# Patient Record
Sex: Female | Born: 1960
Health system: Southern US, Community
[De-identification: ages and names within clinical notes are randomized; demographics above are authoritative.]

## PROBLEM LIST (undated history)

## (undated) DIAGNOSIS — F419 Anxiety disorder, unspecified: Secondary | ICD-10-CM

## (undated) DIAGNOSIS — IMO0002 Reserved for concepts with insufficient information to code with codable children: Secondary | ICD-10-CM

## (undated) DIAGNOSIS — E559 Vitamin D deficiency, unspecified: Secondary | ICD-10-CM

## (undated) DIAGNOSIS — N951 Menopausal and female climacteric states: Secondary | ICD-10-CM

## (undated) DIAGNOSIS — B999 Unspecified infectious disease: Secondary | ICD-10-CM

## (undated) DIAGNOSIS — D219 Benign neoplasm of connective and other soft tissue, unspecified: Secondary | ICD-10-CM

## (undated) DIAGNOSIS — K760 Fatty (change of) liver, not elsewhere classified: Secondary | ICD-10-CM

## (undated) DIAGNOSIS — E7800 Pure hypercholesterolemia, unspecified: Secondary | ICD-10-CM

## (undated) DIAGNOSIS — D1803 Hemangioma of intra-abdominal structures: Secondary | ICD-10-CM

## (undated) DIAGNOSIS — N809 Endometriosis, unspecified: Secondary | ICD-10-CM

## (undated) HISTORY — DX: Anxiety disorder, unspecified: F41.9

## (undated) HISTORY — DX: Reserved for concepts with insufficient information to code with codable children: IMO0002

## (undated) HISTORY — DX: Menopausal and female climacteric states: N95.1

## (undated) HISTORY — DX: Pure hypercholesterolemia, unspecified: E78.00

## (undated) HISTORY — DX: Fatty (change of) liver, not elsewhere classified: K76.0

## (undated) HISTORY — PX: OTHER SURGICAL HISTORY: SHX169

## (undated) HISTORY — DX: Vitamin D deficiency, unspecified: E55.9

## (undated) HISTORY — DX: Hemangioma of intra-abdominal structures: D18.03

---

## 1978-01-13 HISTORY — PX: APPENDECTOMY: SHX54

## 1987-01-14 DIAGNOSIS — IMO0002 Reserved for concepts with insufficient information to code with codable children: Secondary | ICD-10-CM

## 1987-01-14 HISTORY — DX: Reserved for concepts with insufficient information to code with codable children: IMO0002

## 1987-01-14 HISTORY — PX: CERVICAL CONE BIOPSY: SUR198

## 1998-01-13 HISTORY — PX: MYOMECTOMY: SHX85

## 2001-01-13 HISTORY — PX: UTERINE FIBROID EMBOLIZATION: SHX825

## 2005-05-09 ENCOUNTER — Other Ambulatory Visit: Admission: RE | Admit: 2005-05-09 | Discharge: 2005-05-09 | Payer: Self-pay | Admitting: Family Medicine

## 2005-12-04 ENCOUNTER — Emergency Department (HOSPITAL_COMMUNITY): Admission: EM | Admit: 2005-12-04 | Discharge: 2005-12-04 | Payer: Self-pay | Admitting: Emergency Medicine

## 2006-05-13 ENCOUNTER — Other Ambulatory Visit: Admission: RE | Admit: 2006-05-13 | Discharge: 2006-05-13 | Payer: Self-pay | Admitting: Family Medicine

## 2007-07-15 ENCOUNTER — Other Ambulatory Visit: Admission: RE | Admit: 2007-07-15 | Discharge: 2007-07-15 | Payer: Self-pay | Admitting: Family Medicine

## 2009-03-13 HISTORY — PX: DILATION AND CURETTAGE OF UTERUS: SHX78

## 2009-03-21 ENCOUNTER — Ambulatory Visit (HOSPITAL_COMMUNITY): Admission: RE | Admit: 2009-03-21 | Discharge: 2009-03-21 | Payer: Self-pay | Admitting: Obstetrics and Gynecology

## 2010-04-08 LAB — CBC
RBC: 4.44 MIL/uL (ref 3.87–5.11)
RDW: 15.6 % — ABNORMAL HIGH (ref 11.5–15.5)

## 2010-10-31 ENCOUNTER — Encounter: Payer: Self-pay | Admitting: Internal Medicine

## 2010-10-31 ENCOUNTER — Ambulatory Visit (HOSPITAL_BASED_OUTPATIENT_CLINIC_OR_DEPARTMENT_OTHER)
Admission: RE | Admit: 2010-10-31 | Discharge: 2010-10-31 | Disposition: A | Discharge status: Home / Self Care | Payer: Federal, State, Local not specified - PPO | Source: Ambulatory Visit | Attending: Internal Medicine | Admitting: Internal Medicine

## 2010-10-31 ENCOUNTER — Other Ambulatory Visit: Payer: Self-pay | Admitting: Internal Medicine

## 2010-10-31 ENCOUNTER — Ambulatory Visit (INDEPENDENT_AMBULATORY_CARE_PROVIDER_SITE_OTHER): Payer: Federal, State, Local not specified - PPO | Admitting: Internal Medicine

## 2010-10-31 DIAGNOSIS — Z780 Asymptomatic menopausal state: Secondary | ICD-10-CM

## 2010-10-31 DIAGNOSIS — F419 Anxiety disorder, unspecified: Secondary | ICD-10-CM | POA: Insufficient documentation

## 2010-10-31 DIAGNOSIS — Z1231 Encounter for screening mammogram for malignant neoplasm of breast: Secondary | ICD-10-CM

## 2010-10-31 DIAGNOSIS — N951 Menopausal and female climacteric states: Secondary | ICD-10-CM

## 2010-10-31 DIAGNOSIS — Z803 Family history of malignant neoplasm of breast: Secondary | ICD-10-CM

## 2010-10-31 NOTE — Patient Instructions (Signed)
Come in for fasting labs .  They will be mailed to you  Get mammogram today  Schedule complete physical with me

## 2010-10-31 NOTE — Progress Notes (Signed)
Addended by: Granville Lewis on: 10/31/2010 01:44 PM   Modules accepted: Orders

## 2010-10-31 NOTE — Progress Notes (Signed)
Subjective:    Patient ID: Danielle Arnold, female    DOB: 1960/01/14, 50 y.o.   MRN: [id number]  HPI  New pt here for first visit.  [facility] [place].  GYN  Dr. Henderson Cloud  PMH of mild anxiety and cervical dysplasia treated in the late 1980's - pt reports last several cervical paps have all been normal.  She wishes to discuss treatment options for menopausal symptoms.  She has night flushes that seem to be improving, dry skin, and lots of vaginal dryness that is causing discomfort with intercourse.  She reports her Lmp was 10/11 and then she did bleed again in 5/12 for about 20 days.  She was going to see her GYN but eventually stopped so did not have this evaluated.    She has a sister and MGM  with breast cancer.  No  Personal histor of DVT or PE or MI or Stroke  .  She is also due for her mammogram  Allergies  Allergen Reactions  . Penicillins Rash  . Percocet (Oxycodone-Acetaminophen) Itching   Past Medical History  Diagnosis Date  . Menopausal hot flushes   . Abnormal Pap smear and cervical HPV (human papillomavirus) 1989    Cone biopsy  ????dysplasia  . Anxiety     mild on no meds   Past Surgical History  Procedure Date  . Dilation and curettage of uterus 3/11  . Uterine fibroid embolization 2003  . Myomectomy 2000  . Cervical cone biopsy 89  . Ear operation 89 67 68    ear drum repair  . Appendectomy 80   History   Social History  . Marital Status: Single    Spouse Name: N/A    Number of Children: N/A  . Years of Education: N/A   Occupational History  . Not on file.   Social History Main Topics  . Smoking status: Never Smoker   . Smokeless tobacco: Never Used  . Alcohol Use: Yes     glass of wine 3-4 times per week  . Drug Use: No  . Sexually Active: No   Other Topics Concern  . Not on file   Social History Narrative  . No narrative on file   Family History  Problem Relation Age of Onset  . Cancer Sister     breast  premenopausal  .  Cancer Maternal Grandmother     breast   Patient Active Problem List  Diagnoses  . Anxiety  . Menopausal hot flushes  . FH: breast cancer in first degree relative   No current outpatient prescriptions on file prior to visit.        Review of Systems    No chest pain, no SOB, no LE edema, no headache no visual changes Objective:   Physical Exam Physical Exam  Nursing note and vitals reviewed.  Constitutional: She is oriented to person, place, and time. She appears well-developed and well-nourished.  HENT:  Head: Normocephalic and atraumatic.  Cardiovascular: Normal rate and regular rhythm. Exam reveals no gallop and no friction rub.  No murmur heard.  Pulmonary/Chest: Breath sounds normal. She has no wheezes. She has no rales.  Neurological: She is alert and oriented to person, place, and time.  Skin: Skin is warm and dry.  Psychiatric: She has a normal mood and affect. Her behavior is normal.  LE no edema      Assessment & Plan:  1)  Symptomatic menopause:  Extensive counseling of both HT and non  HT options.  Pt given risk/benefit sheet for HT.  I think a non HT option is best for her given sister and MGM with breast CA.  She declines BRCA testing today.   Will check labs on another day as she is not fasting and had 2 eggs for breakfast.  Sheis due for a CPE and will schedule .  Mammogram today  2)  Mild anxiety 3)  Remote cervical dysplasia 4)  FH sister breast CA 5)  Hyperlipidemia  I spent 45 mintutes with this pt

## 2010-11-21 ENCOUNTER — Encounter: Payer: Self-pay | Admitting: Internal Medicine

## 2010-11-21 ENCOUNTER — Ambulatory Visit (INDEPENDENT_AMBULATORY_CARE_PROVIDER_SITE_OTHER): Payer: Federal, State, Local not specified - PPO | Admitting: Internal Medicine

## 2010-11-21 DIAGNOSIS — Z113 Encounter for screening for infections with a predominantly sexual mode of transmission: Secondary | ICD-10-CM

## 2010-11-21 DIAGNOSIS — N951 Menopausal and female climacteric states: Secondary | ICD-10-CM

## 2010-11-21 DIAGNOSIS — Z01419 Encounter for gynecological examination (general) (routine) without abnormal findings: Secondary | ICD-10-CM

## 2010-11-21 DIAGNOSIS — F419 Anxiety disorder, unspecified: Secondary | ICD-10-CM

## 2010-11-21 DIAGNOSIS — Z803 Family history of malignant neoplasm of breast: Secondary | ICD-10-CM

## 2010-11-21 DIAGNOSIS — F411 Generalized anxiety disorder: Secondary | ICD-10-CM

## 2010-11-21 DIAGNOSIS — K589 Irritable bowel syndrome without diarrhea: Secondary | ICD-10-CM | POA: Insufficient documentation

## 2010-11-21 DIAGNOSIS — Z780 Asymptomatic menopausal state: Secondary | ICD-10-CM

## 2010-11-21 DIAGNOSIS — Z139 Encounter for screening, unspecified: Secondary | ICD-10-CM

## 2010-11-21 DIAGNOSIS — Z23 Encounter for immunization: Secondary | ICD-10-CM

## 2010-11-21 DIAGNOSIS — D649 Anemia, unspecified: Secondary | ICD-10-CM | POA: Insufficient documentation

## 2010-11-21 DIAGNOSIS — Z1272 Encounter for screening for malignant neoplasm of vagina: Secondary | ICD-10-CM

## 2010-11-21 DIAGNOSIS — R319 Hematuria, unspecified: Secondary | ICD-10-CM

## 2010-11-21 LAB — CBC WITH DIFFERENTIAL/PLATELET
Basophils Absolute: 0 10*3/uL (ref 0.0–0.1)
Basophils Relative: 1 % (ref 0–1)
Eosinophils Absolute: 0.1 10*3/uL (ref 0.0–0.7)
HCT: 44 % (ref 36.0–46.0)
Lymphs Abs: 1.1 10*3/uL (ref 0.7–4.0)
MCV: 86.6 fL (ref 78.0–100.0)
Monocytes Absolute: 0.3 10*3/uL (ref 0.1–1.0)
Monocytes Relative: 7 % (ref 3–12)
Neutro Abs: 2.9 10*3/uL (ref 1.7–7.7)
RBC: 5.08 MIL/uL (ref 3.87–5.11)

## 2010-11-21 LAB — POCT URINALYSIS DIPSTICK
Glucose, UA: NEGATIVE
Ketones, UA: NEGATIVE
Nitrite, UA: NEGATIVE
Protein, UA: NEGATIVE
Spec Grav, UA: <=1.005
Urobilinogen, UA: NEGATIVE

## 2010-11-21 LAB — FERRITIN: Ferritin: 36 ng/mL (ref 10–291)

## 2010-11-21 MED ORDER — TETANUS-DIPHTH-ACELL PERTUSSIS 5-2.5-18.5 LF-MCG/0.5 IM SUSP: 0.5000 mL | Freq: Once | INTRAMUSCULAR | Status: DC

## 2010-11-21 NOTE — Patient Instructions (Signed)
Labs will be mailed to you  Return prn

## 2010-11-21 NOTE — Progress Notes (Signed)
Subjective:    Patient ID: Danielle Arnold, female    DOB: 10/31/1960, 50 y.o.   MRN: [id number]  HPI  Borski is here for a comprhensive evaluation.  Overall doing well .  She states hot flushes have become less frequent and not too bothersome now.  Her sister would not have BRCA testing and pt is not sure she wants testing now.  Last tetanus 2005.  Has not had pertussis booster.   Colonoscopy done 2008  No polyps found at that time.  Mammogram UTD and neg.  She does report she has had anemia in the past and she states she has donated blood 2-3 times in the last one year period.    She also requests STD testing.  She is out of a relationship for about a year and is not sexually active but wishes to know about STD status.  She did have some urinary urgency that just started today  Allergies  Allergen Reactions  . Penicillins Rash  . Percocet (Oxycodone-Acetaminophen) Itching   Past Medical History  Diagnosis Date  . Menopausal hot flushes   . Abnormal Pap smear and cervical HPV (human papillomavirus) 1989    Cone biopsy  ????dysplasia  . Anxiety     mild on no meds   Past Surgical History  Procedure Date  . Dilation and curettage of uterus 3/11  . Uterine fibroid embolization 2003  . Myomectomy 2000  . Cervical cone biopsy 89  . Ear operation 89 67 68    ear drum repair  . Appendectomy 80   History   Social History  . Marital Status: Single    Spouse Name: N/A    Number of Children: N/A  . Years of Education: N/A   Occupational History  . Not on file.   Social History Main Topics  . Smoking status: Never Smoker   . Smokeless tobacco: Never Used  . Alcohol Use: Yes     glass of wine 3-4 times per week  . Drug Use: No  . Sexually Active: No   Other Topics Concern  . Not on file   Social History Narrative  . No narrative on file   Family History  Problem Relation Age of Onset  . Cancer Sister     breast  premenopausal  . Cancer Maternal Grandmother    breast   Patient Active Problem List  Diagnoses  . Anxiety  . Menopausal hot flushes  . FH: breast cancer in first degree relative  . Anemia  . Irritable bowel   Current Outpatient Prescriptions on File Prior to Visit  Medication Sig Dispense Refill  . cholecalciferol (VITAMIN D) 1000 UNITS tablet Take 400 Units by mouth daily.       . Probiotic Product (SOLUBLE FIBER/PROBIOTICS PO) Take by mouth as needed.        . vitamin B-12 (CYANOCOBALAMIN) 100 MCG tablet Take 50 mcg by mouth daily.        . vitamin E 400 UNIT capsule Take 400 Units by mouth daily.        Marland Kitchen zinc gluconate 50 MG tablet Take 50 mg by mouth daily.         No current facility-administered medications on file prior to visit.         Review of Systems See HPI    Objective:   Physical Exam Physical Exam  Vital signs and nursing note reviewed  Constitutional: She is oriented to person, place, and time. She appears well-developed  and well-nourished. She is cooperative.  HENT:  Head: Normocephalic and atraumatic.  Right Ear: Tympanic membrane normal.  Left Ear: Tympanic membrane normal.  Nose: Nose normal.  Mouth/Throat: Oropharynx is clear and moist and mucous membranes are normal. No oropharyngeal exudate or posterior oropharyngeal erythema.  Eyes: Conjunctivae and EOM are normal. Pupils are equal, round, and reactive to light.  Neck: Neck supple. No JVD present. Carotid bruit is not present. No mass and no thyromegaly present.  Cardiovascular: Regular rhythm, normal heart sounds, intact distal pulses and normal pulses.  Exam reveals no gallop and no friction rub.   No murmur heard. Pulses:      Dorsalis pedis pulses are 2+ on the right side, and 2+ on the left side.  Pulmonary/Chest: Breath sounds normal. She has no wheezes. She has no rhonchi. She has no rales. Right breast exhibits no mass, no nipple discharge and no skin change. Left breast exhibits no mass, no nipple discharge and no skin change.    Abdominal: Soft. Bowel sounds are normal. She exhibits no distension and no mass. There is no hepatosplenomegaly. There is no tenderness. There is no CVA tenderness.  Genitourinary: Rectum normal, vagina normal and uterus normal. Rectal exam shows no mass. Guaiac negative stool. No labial fusion. There is no lesion on the right labia. There is no lesion on the left labia. Cervix exhibits no motion tenderness. Right adnexum displays no mass, no tenderness and no fullness. Left adnexum displays no mass, no tenderness and no fullness. No erythema around the vagina.  Musculoskeletal:       No active synovitis to any joint.    Lymphadenopathy:       Right cervical: No superficial cervical adenopathy present.      Left cervical: No superficial cervical adenopathy present.       Right axillary: No pectoral and no lateral adenopathy present.       Left axillary: No pectoral and no lateral adenopathy present.      Right: No inguinal adenopathy present.       Left: No inguinal adenopathy present.  Neurological: She is alert and oriented to person, place, and time. She has normal strength and normal reflexes. No cranial nerve deficit or sensory deficit. She displays a negative Romberg sign. Coordination and gait normal.  Skin: Skin is warm and dry. No abrasion, no bruising, no ecchymosis and no rash noted. No cyanosis. Nails show no clubbing.  Psychiatric: She has a normal mood and affect. Her speech is normal and behavior is normal.          Assessment & Plan:  1)  Health Maintenance  : Tdap given today.  Declines flu vaccine despite my counsel.  Pap today with Chlamydia and GC.Marland Kitchen   Check serology for RPR and HIv See scanned HM sheet  Dash diet given 2)  H/o anemia  Will check CBC and ferritin today.  Lipids and chemistries  3)  FH pos for breast ca  She does not wish BRCA now 4)  Vasomotor flushes   Improved for no 5)  Abnormal u/a  Will send for culture pt advise to call Monday for  results        Assessment & Plan:

## 2010-11-22 LAB — COMPREHENSIVE METABOLIC PANEL
Albumin: 4.5 g/dL (ref 3.5–5.2)
Calcium: 9.9 mg/dL (ref 8.4–10.5)
Chloride: 100 mEq/L (ref 96–112)
Glucose, Bld: 74 mg/dL (ref 70–99)
Potassium: 4.1 mEq/L (ref 3.5–5.3)
Sodium: 140 mEq/L (ref 135–145)
Total Protein: 7.2 g/dL (ref 6.0–8.3)

## 2010-11-22 LAB — LIPID PANEL
Cholesterol: 217 mg/dL — ABNORMAL HIGH (ref 0–200)
HDL: 88 mg/dL (ref >39)
LDL Cholesterol: 118 mg/dL — ABNORMAL HIGH (ref 0–99)
Total CHOL/HDL Ratio: 2.5 Ratio
Triglycerides: 54 mg/dL (ref <150)

## 2010-11-23 LAB — CULTURE, URINE COMPREHENSIVE: Organism ID, Bacteria: NO GROWTH

## 2010-11-25 ENCOUNTER — Encounter: Payer: Self-pay | Admitting: Emergency Medicine

## 2010-11-25 ENCOUNTER — Telehealth: Payer: Self-pay | Admitting: Internal Medicine

## 2010-11-25 DIAGNOSIS — B359 Dermatophytosis, unspecified: Secondary | ICD-10-CM

## 2010-11-25 MED ORDER — NYSTATIN-TRIAMCINOLONE 100000-0.1 UNIT/GM-% EX OINT: TOPICAL_OINTMENT | Freq: Two times a day (BID) | CUTANEOUS | Status: DC

## 2010-11-25 NOTE — Telephone Encounter (Signed)
Pt would like a call back; she thinks she may has bladder infection. Pt have some concerns, Please call back on [phone] thanks.

## 2010-11-25 NOTE — Telephone Encounter (Signed)
Spoke with Orilla, she is agreeable to cream and urine recheck.  She will come in for urinalysis

## 2010-11-25 NOTE — Telephone Encounter (Signed)
Tell Detjen I will send to her pharmacy an antifungal creme that also has an anti--inflammatory.  Use on outside of labia and any irritataed area.  Have her come in Thursday or early next week and I will recheck a urine specimen and examine her to see if she is better

## 2010-11-25 NOTE — Telephone Encounter (Signed)
PC with pt.  She states that she is having burning along the "outside" after she urinates.  She states more along labia and near clitoris, does not have urinary symptoms like urgency or frequency.  Burning comes and goes, had a lot over the weekend.  She is aware urine culture did not grow any bacteria.  She has not looked at the area with a mirror to know if it looks red or inflamed, but states she does hike a lot, sweats, does not go to the bathroom when she hikes.  Is concerned about symptoms, does not know what to do.  Has tried OTC cortisone cream to the area without benefit.

## 2010-11-28 ENCOUNTER — Encounter: Payer: Self-pay | Admitting: Emergency Medicine

## 2011-02-10 ENCOUNTER — Ambulatory Visit (INDEPENDENT_AMBULATORY_CARE_PROVIDER_SITE_OTHER): Payer: Federal, State, Local not specified - PPO | Admitting: Internal Medicine

## 2011-02-10 ENCOUNTER — Other Ambulatory Visit: Payer: Self-pay | Admitting: Internal Medicine

## 2011-02-10 ENCOUNTER — Ambulatory Visit (HOSPITAL_BASED_OUTPATIENT_CLINIC_OR_DEPARTMENT_OTHER)
Admission: RE | Admit: 2011-02-10 | Discharge: 2011-02-10 | Disposition: A | Discharge status: Home / Self Care | Payer: Federal, State, Local not specified - PPO | Source: Ambulatory Visit | Attending: Internal Medicine | Admitting: Internal Medicine

## 2011-02-10 ENCOUNTER — Encounter: Payer: Self-pay | Admitting: Internal Medicine

## 2011-02-10 VITALS — BP 105/67 | HR 66 | Temp 97.8°F | Resp 12 | Ht 66.0 in | Wt 132.1 lb

## 2011-02-10 DIAGNOSIS — R21 Rash and other nonspecific skin eruption: Secondary | ICD-10-CM

## 2011-02-10 DIAGNOSIS — S8990XA Unspecified injury of unspecified lower leg, initial encounter: Secondary | ICD-10-CM

## 2011-02-10 DIAGNOSIS — L309 Dermatitis, unspecified: Secondary | ICD-10-CM | POA: Insufficient documentation

## 2011-02-10 DIAGNOSIS — S99929A Unspecified injury of unspecified foot, initial encounter: Secondary | ICD-10-CM

## 2011-02-10 DIAGNOSIS — M25569 Pain in unspecified knee: Secondary | ICD-10-CM

## 2011-02-10 DIAGNOSIS — S8992XA Unspecified injury of left lower leg, initial encounter: Secondary | ICD-10-CM | POA: Insufficient documentation

## 2011-02-10 DIAGNOSIS — S99919A Unspecified injury of unspecified ankle, initial encounter: Secondary | ICD-10-CM

## 2011-02-10 DIAGNOSIS — M79609 Pain in unspecified limb: Secondary | ICD-10-CM

## 2011-02-10 DIAGNOSIS — L259 Unspecified contact dermatitis, unspecified cause: Secondary | ICD-10-CM

## 2011-02-10 DIAGNOSIS — M25579 Pain in unspecified ankle and joints of unspecified foot: Secondary | ICD-10-CM

## 2011-02-10 DIAGNOSIS — X58XXXA Exposure to other specified factors, initial encounter: Secondary | ICD-10-CM

## 2011-02-10 MED ORDER — IBUPROFEN 800 MG PO TABS: 800.0000 mg | ORAL_TABLET | Freq: Three times a day (TID) | ORAL | Status: AC | PRN

## 2011-02-10 MED ORDER — TRIAMCINOLONE ACETONIDE 0.1 % EX CREA: TOPICAL_CREAM | Freq: Two times a day (BID) | CUTANEOUS | Status: DC

## 2011-02-10 NOTE — Progress Notes (Signed)
Subjective:    Patient ID: Danielle Arnold, female    DOB: [birth year], 51 y.o.   MRN: [id number]  HPI  Delair is here for acute visit with 2 concerns.  She was hiking 8 days ago in [place] and jumped onto a rock and had pain in L leg and ankel.  L lateral malleolus awas swollen and she iced it for a few days.  All swelling gone now but she has pain in tib/fib area and numbness on Lateral side of lower leg and her foot.    She denies twisting ankle  Also has red rash on neck.  Not itchy.  Has noted red spots on other parts of body but there is none present now  Allergies  Allergen Reactions  . Penicillins Rash  . Percocet (Oxycodone-Acetaminophen) Itching   Past Medical History  Diagnosis Date  . Menopausal hot flushes   . Abnormal Pap smear and cervical HPV (human papillomavirus) 1989    Cone biopsy  ????dysplasia  . Anxiety     mild on no meds   Past Surgical History  Procedure Date  . Dilation and curettage of uterus 3/11  . Uterine fibroid embolization 2003  . Myomectomy 2000  . Cervical cone biopsy 89  . Ear operation 89 67 68    ear drum repair  . Appendectomy 80   History   Social History  . Marital Status: Single    Spouse Name: N/A    Number of Children: N/A  . Years of Education: N/A   Occupational History  . Not on file.   Social History Main Topics  . Smoking status: Never Smoker   . Smokeless tobacco: Never Used  . Alcohol Use: Yes     glass of wine 3-4 times per week  . Drug Use: No  . Sexually Active: No   Other Topics Concern  . Not on file   Social History Narrative  . No narrative on file   Family History  Problem Relation Age of Onset  . Cancer Sister     breast  premenopausal  . Cancer Maternal Grandmother     breast   Patient Active Problem List  Diagnoses  . Anxiety  . Menopausal hot flushes  . FH: breast cancer in first degree relative  . Anemia  . Irritable bowel  . Left leg injury  . Dermatitis   Current  Outpatient Prescriptions on File Prior to Visit  Medication Sig Dispense Refill  . cholecalciferol (VITAMIN D) 1000 UNITS tablet Take 400 Units by mouth daily.       . Probiotic Product (SOLUBLE FIBER/PROBIOTICS PO) Take by mouth as needed.        . vitamin B-12 (CYANOCOBALAMIN) 100 MCG tablet Take 50 mcg by mouth daily.        . vitamin E 400 UNIT capsule Take 400 Units by mouth daily.        Marland Kitchen zinc gluconate 50 MG tablet Take 50 mg by mouth daily.         Current Facility-Administered Medications on File Prior to Visit  Medication Dose Route Frequency Provider Last Rate Last Dose  . DISCONTD: TDaP (BOOSTRIX) injection 0.5 mL  0.5 mL Intramuscular Once Levon Hedger, MD          Review of Systems See HPI    Objective:   Physical Exam Physical Exam  Nursing note and vitals reviewed.  Constitutional: She is oriented to person, place, and time. She appears well-developed and  well-nourished.  HENT:  Head: Normocephalic and atraumatic.  Cardiovascular: Normal rate and regular rhythm. Exam reveals no gallop and no friction rub.  No murmur heard.  Pulmonary/Chest: Breath sounds normal. She has no wheezes. She has no rales.  Neurological: She is alert and oriented to person, place, and time. M/S  No edema of ankle, lower leg or knee. No erythema or burising.  Good N-V status to foot.  Good L pedal pulse.   Sensation slighly decreased near fibula in lower leg   Skin: Skin is warm and dry.  Small 0.5 cm red macular lesion mid trachea of neck.   Psychiatric: She has a normal mood and affect. Her behavior is normal.       Assessment & Plan:  1)  L leg injury with paresthesiA  Will get imaging today.  OK to take Ibuprofen 800 mg b-tid  Recheck with me in 2 weeks.  OK for velcro ankle support 2)  Skin nospecific dermatitis.  Will give TAC creme o.5%  Recheck in 2-3 weeks.  Pt voices understanding

## 2011-02-10 NOTE — Patient Instructions (Signed)
To xray now  Take Ibuprofen twice a day or every 8 hours if severe discomfort  Ok for velcro ankle brace  Call office tomorrow for results  See me in two weeks

## 2011-02-11 ENCOUNTER — Telehealth: Payer: Self-pay | Admitting: Internal Medicine

## 2011-02-11 NOTE — Telephone Encounter (Signed)
Spoke with pt an dinformed of X-ray results.  Numbness may be traumatic neuropathy that should heal with time.  Advised Ibuprofen and avoidance of any wieght bearing exercise of le leg until I re-evaluate

## 2011-02-20 ENCOUNTER — Telehealth: Payer: Self-pay | Admitting: Emergency Medicine

## 2011-02-20 NOTE — Telephone Encounter (Signed)
Geralyn called today concerned she was having symptoms of a stroke.  She states that she has been having pins and needles feelings "throughout my body for several days".  She states that today she has a headache and can not see clearly out of one eye as well as the other.  She states "I am afraid I am going to lay down and go to sleep and not wake up".  She would like to know if these are signs of a stroke.  I asked her to look in the mirror and tell me if her smile was straight, she states it is.  Her words are clear and in logical order.  I told her if she is at all concerned about her well being, she should seek care today at either the emergency room or an urgent care.  She again asked me about the signs of a stroke and I advised her about slurred speech, sudden loss of function or weakness on one side of the body, severe headache, ect.  She was reluctant to go the ER stating "well my headache isn't the worst ever".  I strongly advised her to seek care today, whether at urgent care or ED- she verbalized understanding

## 2011-03-03 ENCOUNTER — Ambulatory Visit: Payer: Federal, State, Local not specified - PPO | Admitting: Internal Medicine

## 2011-03-04 ENCOUNTER — Ambulatory Visit: Payer: Federal, State, Local not specified - PPO | Admitting: Internal Medicine

## 2011-07-06 ENCOUNTER — Encounter: Payer: Self-pay | Admitting: Internal Medicine

## 2011-07-06 DIAGNOSIS — M7730 Calcaneal spur, unspecified foot: Secondary | ICD-10-CM | POA: Insufficient documentation

## 2011-07-06 DIAGNOSIS — M722 Plantar fascial fibromatosis: Secondary | ICD-10-CM | POA: Insufficient documentation

## 2011-11-18 ENCOUNTER — Ambulatory Visit (INDEPENDENT_AMBULATORY_CARE_PROVIDER_SITE_OTHER): Payer: Federal, State, Local not specified - PPO | Admitting: Internal Medicine

## 2011-11-18 ENCOUNTER — Encounter: Payer: Self-pay | Admitting: Internal Medicine

## 2011-11-18 VITALS — BP 127/73 | HR 66 | Temp 97.2°F | Resp 18 | Ht 66.0 in | Wt 135.0 lb

## 2011-11-18 DIAGNOSIS — M5430 Sciatica, unspecified side: Secondary | ICD-10-CM | POA: Insufficient documentation

## 2011-11-18 DIAGNOSIS — M2550 Pain in unspecified joint: Secondary | ICD-10-CM

## 2011-11-18 MED ORDER — NABUMETONE 500 MG PO TABS: 500.0000 mg | ORAL_TABLET | Freq: Two times a day (BID) | ORAL | Status: DC

## 2011-11-18 MED ORDER — CYCLOBENZAPRINE HCL 5 MG PO TABS
5.0000 mg | ORAL_TABLET | Freq: Three times a day (TID) | ORAL | Status: DC | PRN
Start: 2011-11-18 — End: 2012-10-13

## 2011-11-18 NOTE — Patient Instructions (Addendum)
Will set up referral to pt  Take meds as ordered

## 2011-11-18 NOTE — Progress Notes (Signed)
Subjective:    Patient ID: Danielle Arnold, female    DOB: 12/16/1960, 51 y.o.   MRN: [id number]  HPI Makala is here for acute visit.  She reports chronic L sided back pain that radiates down left leg off and on since summer.  She reports a history of sciatica that began years ago.  She was in [place] from July to November and saw a chiropracter that provided little relief.  3 days ago she went on a 13 mile hike and low back pain recurred.    She had used OTC Nsaids.  She has occasional numbness in L leg.   Never had PT  She also wonders about Lyme disease as she is frequently in the woods and frequent tick exposure. She denies rash and has genralized arthralgias off and on.h  Allergies  Allergen Reactions  . Penicillins Rash  . Percocet (Oxycodone-Acetaminophen) Itching   Past Medical History  Diagnosis Date  . Menopausal hot flushes   . Abnormal Pap smear and cervical HPV (human papillomavirus) 1989    Cone biopsy  ????dysplasia  . Anxiety     mild on no meds   Past Surgical History  Procedure Date  . Dilation and curettage of uterus 3/11  . Uterine fibroid embolization 2003  . Myomectomy 2000  . Cervical cone biopsy 89  . Ear operation 89 67 68    ear drum repair  . Appendectomy 80   History   Social History  . Marital Status: Single    Spouse Name: N/A    Number of Children: N/A  . Years of Education: N/A   Occupational History  . Not on file.   Social History Main Topics  . Smoking status: Never Smoker   . Smokeless tobacco: Never Used  . Alcohol Use: Yes     Comment: glass of wine 3-4 times per week  . Drug Use: No  . Sexually Active: No   Other Topics Concern  . Not on file   Social History Narrative  . No narrative on file   Family History  Problem Relation Age of Onset  . Cancer Sister     breast  premenopausal  . Cancer Maternal Grandmother     breast   Patient Active Problem List  Diagnosis  . Anxiety  . Menopausal hot flushes  .  FH: breast cancer in first degree relative  . Anemia  . Irritable bowel  . Left leg injury  . Dermatitis  . Plantar fasciitis  . Heel spur  . Sciatica   Current Outpatient Prescriptions on File Prior to Visit  Medication Sig Dispense Refill  . cholecalciferol (VITAMIN D) 1000 UNITS tablet Take 400 Units by mouth daily.       . vitamin B-12 (CYANOCOBALAMIN) 100 MCG tablet Take 50 mcg by mouth daily.        . vitamin E 400 UNIT capsule Take 400 Units by mouth daily.        . Probiotic Product (SOLUBLE FIBER/PROBIOTICS PO) Take by mouth as needed.        . zinc gluconate 50 MG tablet Take 50 mg by mouth daily.             Review of Systems    see HPI Objective:   Physical Exam Physical Exam  Nursing note and vitals reviewed.  Constitutional: She is oriented to person, place, and time. She appears well-developed and well-nourished.  HENT:  Head: Normocephalic and atraumatic.  Cardiovascular: Normal rate and  regular rhythm. Exam reveals no gallop and no friction rub.  No murmur heard.  Pulmonary/Chest: Breath sounds normal. She has no wheezes. She has no rales.  Neurological: She is alert and oriented to person, place, and time.   Lower extremities  Normal refelxes  SLR neg bialterally Motor 5/5 Le  Sensation intact  Pain along SI joint  Skin: Skin is warm and dry.   No rash noted Psychiatric: She has a normal mood and affect. Her behavior is normal.  M/S  There is tightening of muslce in L paravertebral muscles Joint  No active synovitis in any joint             Assessment & Plan:  Sciatica:  Will give flexeril tid prn and relafen bid.  Start PT and refer to Dr. Pearletha Forge.  Keep follow up appt with me  sacroiliitsi  There may be a component of this as well.  If not better will need imaging  Tick exposure will get Lyme titer   Doubt current symtpoms related   arthralgias

## 2011-11-19 ENCOUNTER — Ambulatory Visit: Payer: Federal, State, Local not specified - PPO | Admitting: Physical Therapy

## 2011-11-19 ENCOUNTER — Ambulatory Visit: Payer: Federal, State, Local not specified - PPO | Admitting: Family Medicine

## 2011-11-19 ENCOUNTER — Ambulatory Visit (INDEPENDENT_AMBULATORY_CARE_PROVIDER_SITE_OTHER): Payer: Federal, State, Local not specified - PPO | Admitting: Family Medicine

## 2011-11-19 VITALS — BP 127/84 | Ht 66.0 in | Wt 135.0 lb

## 2011-11-19 DIAGNOSIS — M545 Low back pain, unspecified: Secondary | ICD-10-CM

## 2011-11-19 NOTE — Patient Instructions (Addendum)
You have lumbar radiculopathy (an irritated nerve in your low back). Take tylenol for baseline pain relief (1-2 extra strength tabs 3x/day) Nabumetone twice a day with food regularly for a week then as needed after this. Flexeril (cyclobenzaprine) as needed for muscle spasms (no driving on this medicine). Stay as active as possible. Physical therapy has been shown to be helpful as well - start this as soon as possible and do home exercises on days you don't go there. Strengthening of low back muscles, abdominal musculature are key for long term pain relief. If not improving, will consider further imaging (x-rays and MRI). Follow up with me in 1 month to 6 weeks for reevaluation.

## 2011-11-21 ENCOUNTER — Encounter: Payer: Self-pay | Admitting: Family Medicine

## 2011-11-21 NOTE — Progress Notes (Signed)
Subjective:    Patient ID: Danielle Arnold, female    DOB: Oct 27, 1960, 51 y.o.   MRN: [id number]  PCP: Dr. Constance Goltz  HPI 51 yo F here for low back pain  Patient states since June she has had left greater than right low back pain radiating into groin/anterior thigh area. No known injury - she fell in September but this was after she'd already had the pain. Had been seeing chiropractor without much help. Likes to hike for exercise - seems worse when she does this (esp after 13 mile hike recently). History of sciatica. Tried ibuprofen though this was just recently prescribed. No numbness/tingling. No bowel/bladder dysfunction.  Past Medical History  Diagnosis Date  . Menopausal hot flushes   . Abnormal Pap smear and cervical HPV (human papillomavirus) 1989    Cone biopsy  ????dysplasia  . Anxiety     mild on no meds    Current Outpatient Prescriptions on File Prior to Visit  Medication Sig Dispense Refill  . cholecalciferol (VITAMIN D) 1000 UNITS tablet Take 400 Units by mouth daily.       . cyclobenzaprine (FLEXERIL) 5 MG tablet Take 1 tablet (5 mg total) by mouth 3 (three) times daily as needed for muscle spasms.  30 tablet  1  . nabumetone (RELAFEN) 500 MG tablet Take 1 tablet (500 mg total) by mouth 2 (two) times daily.  30 tablet  1  . Probiotic Product (SOLUBLE FIBER/PROBIOTICS PO) Take by mouth as needed.        . vitamin B-12 (CYANOCOBALAMIN) 100 MCG tablet Take 50 mcg by mouth daily.        . vitamin E 400 UNIT capsule Take 400 Units by mouth daily.        Marland Kitchen zinc gluconate 50 MG tablet Take 50 mg by mouth daily.          Past Surgical History  Procedure Date  . Dilation and curettage of uterus 3/11  . Uterine fibroid embolization 2003  . Myomectomy 2000  . Cervical cone biopsy 89  . Ear operation 89 67 68    ear drum repair  . Appendectomy 80    Allergies  Allergen Reactions  . Penicillins Rash  . Percocet (Oxycodone-Acetaminophen) Itching    History     Social History  . Marital Status: Single    Spouse Name: N/A    Number of Children: N/A  . Years of Education: N/A   Occupational History  . Not on file.   Social History Main Topics  . Smoking status: Never Smoker   . Smokeless tobacco: Never Used  . Alcohol Use: Yes     Comment: glass of wine 3-4 times per week  . Drug Use: No  . Sexually Active: No   Other Topics Concern  . Not on file   Social History Narrative  . No narrative on file    Family History  Problem Relation Age of Onset  . Cancer Sister     breast  premenopausal  . Cancer Maternal Grandmother     breast  . Sudden death Neg Hx   . Hypertension Neg Hx   . Heart attack Neg Hx   . Diabetes Neg Hx   . Hyperlipidemia Neg Hx     BP 127/84  Ht 5\' 6"  (1.676 m)  Wt 135 lb (61.236 kg)  BMI 21.79 kg/m2  Review of Systems See HPI above.    Objective:   Physical Exam Gen: NAD  Back: No  gross deformity, scoliosis. No focal TTP paraspinal muscles.  No midline or bony TTP. FROM with mild pain on flexion > extension. Strength LEs 5/5 all muscle groups.   2+ MSRs in patellar and achilles tendons, equal bilaterally. Negative SLRs. Sensation intact to light touch bilaterally. Negative logroll bilateral hips Negative fabers and piriformis stretches.    Assessment & Plan:  1. Low back pain - her history is consistent with a mild lumbar radiculopathy.  Start formal physical therapy and home exercises.  Nabumetone, flexeril as needed.  Declined prednisone (typically reserved for severe symptoms).  Consider imaging if not improving as expected.

## 2011-11-25 ENCOUNTER — Ambulatory Visit: Payer: Federal, State, Local not specified - PPO | Attending: Family Medicine | Admitting: Rehabilitation

## 2011-11-25 ENCOUNTER — Ambulatory Visit: Payer: Federal, State, Local not specified - PPO | Admitting: Internal Medicine

## 2011-11-25 DIAGNOSIS — IMO0001 Reserved for inherently not codable concepts without codable children: Secondary | ICD-10-CM | POA: Insufficient documentation

## 2011-11-25 DIAGNOSIS — M545 Low back pain, unspecified: Secondary | ICD-10-CM | POA: Insufficient documentation

## 2011-11-27 DIAGNOSIS — M5416 Radiculopathy, lumbar region: Secondary | ICD-10-CM | POA: Insufficient documentation

## 2011-11-27 NOTE — Assessment & Plan Note (Signed)
her history is consistent with a mild lumbar radiculopathy.  Start formal physical therapy and home exercises.  Nabumetone, flexeril as needed.  Declined prednisone (typically reserved for severe symptoms).  Consider imaging if not improving as expected.

## 2011-12-02 ENCOUNTER — Ambulatory Visit: Payer: Federal, State, Local not specified - PPO | Admitting: Rehabilitation

## 2011-12-04 ENCOUNTER — Ambulatory Visit: Payer: Federal, State, Local not specified - PPO | Admitting: Rehabilitation

## 2011-12-09 ENCOUNTER — Ambulatory Visit: Payer: Federal, State, Local not specified - PPO | Admitting: Rehabilitation

## 2011-12-15 ENCOUNTER — Ambulatory Visit: Payer: Federal, State, Local not specified - PPO | Attending: Family Medicine | Admitting: Rehabilitation

## 2011-12-15 DIAGNOSIS — IMO0001 Reserved for inherently not codable concepts without codable children: Secondary | ICD-10-CM | POA: Insufficient documentation

## 2011-12-15 DIAGNOSIS — M545 Low back pain, unspecified: Secondary | ICD-10-CM | POA: Insufficient documentation

## 2011-12-18 ENCOUNTER — Ambulatory Visit: Payer: Federal, State, Local not specified - PPO | Admitting: Rehabilitation

## 2011-12-23 ENCOUNTER — Ambulatory Visit: Payer: Federal, State, Local not specified - PPO | Admitting: Rehabilitation

## 2011-12-25 ENCOUNTER — Ambulatory Visit (HOSPITAL_BASED_OUTPATIENT_CLINIC_OR_DEPARTMENT_OTHER)
Admission: RE | Admit: 2011-12-25 | Discharge: 2011-12-25 | Disposition: A | Discharge status: Home / Self Care | Payer: Federal, State, Local not specified - PPO | Source: Ambulatory Visit | Attending: Internal Medicine | Admitting: Internal Medicine

## 2011-12-25 ENCOUNTER — Ambulatory Visit (INDEPENDENT_AMBULATORY_CARE_PROVIDER_SITE_OTHER): Payer: Federal, State, Local not specified - PPO | Admitting: Internal Medicine

## 2011-12-25 ENCOUNTER — Encounter: Payer: Self-pay | Admitting: Internal Medicine

## 2011-12-25 VITALS — BP 120/70 | HR 59 | Temp 97.2°F | Resp 18 | Ht 66.0 in | Wt 135.0 lb

## 2011-12-25 DIAGNOSIS — R5381 Other malaise: Secondary | ICD-10-CM

## 2011-12-25 DIAGNOSIS — N898 Other specified noninflammatory disorders of vagina: Secondary | ICD-10-CM | POA: Insufficient documentation

## 2011-12-25 DIAGNOSIS — N951 Menopausal and female climacteric states: Secondary | ICD-10-CM

## 2011-12-25 DIAGNOSIS — Z0000 Encounter for general adult medical examination without abnormal findings: Secondary | ICD-10-CM

## 2011-12-25 DIAGNOSIS — F419 Anxiety disorder, unspecified: Secondary | ICD-10-CM

## 2011-12-25 DIAGNOSIS — Z1231 Encounter for screening mammogram for malignant neoplasm of breast: Secondary | ICD-10-CM | POA: Insufficient documentation

## 2011-12-25 DIAGNOSIS — K5900 Constipation, unspecified: Secondary | ICD-10-CM | POA: Insufficient documentation

## 2011-12-25 DIAGNOSIS — F411 Generalized anxiety disorder: Secondary | ICD-10-CM

## 2011-12-25 DIAGNOSIS — M5430 Sciatica, unspecified side: Secondary | ICD-10-CM

## 2011-12-25 DIAGNOSIS — B359 Dermatophytosis, unspecified: Secondary | ICD-10-CM | POA: Insufficient documentation

## 2011-12-25 DIAGNOSIS — R5383 Other fatigue: Secondary | ICD-10-CM

## 2011-12-25 DIAGNOSIS — N9489 Other specified conditions associated with female genital organs and menstrual cycle: Secondary | ICD-10-CM

## 2011-12-25 LAB — CBC WITH DIFFERENTIAL/PLATELET
Basophils Absolute: 0.1 10*3/uL (ref 0.0–0.1)
Basophils Relative: 1 % (ref 0–1)
Eosinophils Absolute: 0.1 10*3/uL (ref 0.0–0.7)
HCT: 44.4 % (ref 36.0–46.0)
MCH: 29 pg (ref 26.0–34.0)
MCHC: 33.6 g/dL (ref 30.0–36.0)
Monocytes Absolute: 0.4 10*3/uL (ref 0.1–1.0)
Neutro Abs: 3.8 10*3/uL (ref 1.7–7.7)
Neutrophils Relative %: 66 % (ref 43–77)
RDW: 13.9 % (ref 11.5–15.5)

## 2011-12-25 LAB — LIPID PANEL
LDL Cholesterol: 150 mg/dL — ABNORMAL HIGH (ref 0–99)
Triglycerides: 65 mg/dL (ref <150)

## 2011-12-25 LAB — COMPREHENSIVE METABOLIC PANEL
Alkaline Phosphatase: 76 U/L (ref 39–117)
BUN: 17 mg/dL (ref 6–23)
Creat: 0.88 mg/dL (ref 0.50–1.10)
Glucose, Bld: 87 mg/dL (ref 70–99)
Total Bilirubin: 0.8 mg/dL (ref 0.3–1.2)

## 2011-12-25 LAB — POCT URINALYSIS DIPSTICK
Leukocytes, UA: NEGATIVE
Nitrite, UA: NEGATIVE
Protein, UA: NEGATIVE
pH, UA: 5

## 2011-12-25 MED ORDER — NYSTATIN-TRIAMCINOLONE 100000-0.1 UNIT/GM-% EX OINT: TOPICAL_OINTMENT | Freq: Two times a day (BID) | CUTANEOUS | Status: DC

## 2011-12-25 NOTE — Addendum Note (Signed)
Addended by: Mathews Robinsons on: 12/25/2011 11:07 AM   Modules accepted: Orders

## 2011-12-25 NOTE — Addendum Note (Signed)
Addended by: Perry Mount L on: 12/25/2011 11:11 AM   Modules accepted: Orders

## 2011-12-25 NOTE — Progress Notes (Signed)
Subjective:    Patient ID: Danielle Arnold, female    DOB: 13-Oct-1960, 51 y.o.   MRN: [id number]  HPI Jezreel is here for CPE.  Her back is doing better  She has started PT and she has mostly good days.  She has itchy rash around umbilicus at times.     Some constipation that will aggravate her hemmorrhoids  She [place] dermat[place] in [place] that [place] her yearly.   [facility]  Allergies  Allergen Reactions  . Penicillins Rash  . Percocet (Oxycodone-Acetaminophen) Itching   Past Medical History  Diagnosis Date  . Menopausal hot flushes   . Abnormal Pap smear and cervical HPV (human papillomavirus) 1989    Cone biopsy  ????dysplasia  . Anxiety     mild on no meds   Past Surgical History  Procedure Date  . Dilation and curettage of uterus 3/11  . Uterine fibroid embolization 2003  . Myomectomy 2000  . Cervical cone biopsy 89  . Ear operation 89 67 68    ear drum repair  . Appendectomy 80   History   Social History  . Marital Status: Single    Spouse Name: N/A    Number of Children: N/A  . Years of Education: N/A   Occupational History  . Not on file.   Social History Main Topics  . Smoking status: Never Smoker   . Smokeless tobacco: Never Used  . Alcohol Use: Yes     Comment: glass of wine 3-4 times per week  . Drug Use: No  . Sexually Active: No   Other Topics Concern  . Not on file   Social History Narrative  . No narrative on file   Family History  Problem Relation Age of Onset  . Cancer Sister     breast  premenopausal  . Cancer Maternal Grandmother     breast  . Sudden death Neg Hx   . Hypertension Neg Hx   . Heart attack Neg Hx   . Diabetes Neg Hx   . Hyperlipidemia Neg Hx    Patient Active Problem List  Diagnosis  . Anxiety  . Menopausal hot flushes  . FH: breast cancer in first degree relative  . Anemia  . Irritable bowel  . Left leg injury  . Dermatitis  . Plantar fasciitis  . Heel spur  . Sciatica  . Low back  pain   Current Outpatient Prescriptions on File Prior to Visit  Medication Sig Dispense Refill  . cholecalciferol (VITAMIN D) 1000 UNITS tablet Take 400 Units by mouth daily.       . cyclobenzaprine (FLEXERIL) 5 MG tablet Take 1 tablet (5 mg total) by mouth 3 (three) times daily as needed for muscle spasms.  30 tablet  1  . nabumetone (RELAFEN) 500 MG tablet Take 1 tablet (500 mg total) by mouth 2 (two) times daily.  30 tablet  1  . Probiotic Product (SOLUBLE FIBER/PROBIOTICS PO) Take by mouth as needed.        . vitamin B-12 (CYANOCOBALAMIN) 100 MCG tablet Take 50 mcg by mouth daily.        . vitamin E 400 UNIT capsule Take 400 Units by mouth daily.        Marland Kitchen zinc gluconate 50 MG tablet Take 50 mg by mouth daily.             Review of Systems    see HPI Objective:   Physical Exam Physical Exam  Nursing note  and vitals reviewed.  Constitutional: She is oriented to person, place, and time. She appears well-developed and well-nourished.  HENT:  Head: Normocephalic and atraumatic.  Right Ear: Tympanic membrane and ear canal normal. No drainage. Tympanic membrane is not injected and not erythematous.  Left Ear: Tympanic membrane and ear canal normal. No drainage. Tympanic membrane is not injected and not erythematous.  Nose: Nose normal. Right sinus exhibits no maxillary sinus tenderness and no frontal sinus tenderness. Left sinus exhibits no maxillary sinus tenderness and no frontal sinus tenderness.  Mouth/Throat: Oropharynx is clear and moist. No oral lesions. No oropharyngeal exudate.  Eyes: Conjunctivae and EOM are normal. Pupils are equal, round, and reactive to light.  Neck: Normal range of motion. Neck supple. No JVD present. Carotid bruit is not present. No mass and no thyromegaly present.  Cardiovascular: Normal rate, regular rhythm, S1 normal, S2 normal and intact distal pulses. Exam reveals no gallop and no friction rub.  No murmur heard.  Pulses:  Carotid pulses are 2+ on  the right side, and 2+ on the left side.  Dorsalis pedis pulses are 2+ on the right side, and 2+ on the left side.  No carotid bruit. No LE edema  Pulmonary/Chest: Breath sounds normal. She has no wheezes. She has no rales. She exhibits no tenderness.  Breast no discrete masses no nipple discharge no axillary adenopathy Abdominal: Soft. Bowel sounds are normal. She exhibits no distension and no mass. There is no hepatosplenomegaly. There is no tenderness. There is no CVA tenderness.  Rectal guaiac neg  No mass.  Has external hemmoroidal tag Musculoskeletal: Normal range of motion.  No active synovitis to joints.  Lymphadenopathy:  She has no cervical adenopathy.  She has no axillary adenopathy.  Right: No inguinal and no supraclavicular adenopathy present.  Left: No inguinal and no supraclavicular adenopathy present.  Neurological: She is alert and oriented to person, place, and time. She has normal strength and normal reflexes. She displays no tremor. No cranial nerve deficit or sensory deficit. Coordination and gait normal.  Skin: Skin is warm and dry. No rash noted. No cyanosis. Nails show no clubbing. She has one dark nevus lower abdomen Psychiatric: She has a normal mood and affect. Her speech is normal and behavior is normal. Cognition and memory are normal.           Assessment & Plan:  Health Maintenance:  MM schedule today  Labs today  Tinea dermatiits periumbilically:  OK for Mycolog bid  Skin nevus  Pt sees [facility]    Lumbar radiculoapthy  improving  Constipation  Ok for colace  Vaginal dryness  Luvena samples given  FH breast cancer  Mm today

## 2011-12-25 NOTE — Patient Instructions (Addendum)
Activate my chart 

## 2011-12-29 ENCOUNTER — Telehealth: Payer: Self-pay | Admitting: *Deleted

## 2011-12-29 NOTE — Telephone Encounter (Signed)
Pt states that she received a call from Dr Constance Goltz regarding Lyme test returning call

## 2011-12-30 ENCOUNTER — Encounter: Payer: Self-pay | Admitting: *Deleted

## 2011-12-30 ENCOUNTER — Telehealth: Payer: Self-pay | Admitting: *Deleted

## 2011-12-30 NOTE — Telephone Encounter (Signed)
Left message regarding Lyme test

## 2011-12-31 ENCOUNTER — Encounter: Payer: Self-pay | Admitting: Internal Medicine

## 2011-12-31 ENCOUNTER — Other Ambulatory Visit: Payer: Self-pay | Admitting: Internal Medicine

## 2011-12-31 ENCOUNTER — Telehealth: Payer: Self-pay | Admitting: Internal Medicine

## 2011-12-31 DIAGNOSIS — R922 Inconclusive mammogram: Secondary | ICD-10-CM

## 2011-12-31 DIAGNOSIS — E785 Hyperlipidemia, unspecified: Secondary | ICD-10-CM | POA: Insufficient documentation

## 2011-12-31 NOTE — Telephone Encounter (Signed)
Spoke with pt and informed of Mammogram results.  Will order breast ultrasound at [facility] imaging  Prior mm at [facility]  Dr. Henderson Cloud

## 2012-01-05 ENCOUNTER — Telehealth: Payer: Self-pay | Admitting: Internal Medicine

## 2012-01-05 ENCOUNTER — Ambulatory Visit: Payer: Federal, State, Local not specified - PPO | Admitting: Rehabilitation

## 2012-01-05 NOTE — Telephone Encounter (Signed)
Returned pt call regarding breast US

## 2012-01-05 NOTE — Telephone Encounter (Signed)
Pt needs to speak with the Doctor per her voicemail on today at 810 am via [phone] about her breast exam on the 30 th of December.Marland KitchenMarland Kitchen

## 2012-01-08 ENCOUNTER — Telehealth: Payer: Self-pay | Admitting: *Deleted

## 2012-01-08 ENCOUNTER — Encounter: Payer: Self-pay | Admitting: *Deleted

## 2012-01-08 NOTE — Telephone Encounter (Signed)
Called pt regarding cholesterol left message to return call

## 2012-01-08 NOTE — Telephone Encounter (Signed)
Message copied by Mathews Robinsons on Thu Jan 08, 2012 12:04 PM ------      Message from: Raechel Chute D      Created: Wed Dec 31, 2011 12:00 PM       Danielle Arnold            Call pt and let her know her cholesterol is much higher that one year ago.  Send her DASH diet and have her follow this.  See me in 3 months and come in fasting and we will recheck             OK to mail labs to pt

## 2012-01-12 ENCOUNTER — Ambulatory Visit
Admission: RE | Admit: 2012-01-12 | Discharge: 2012-01-12 | Disposition: A | Discharge status: Home / Self Care | Payer: Federal, State, Local not specified - PPO | Source: Ambulatory Visit | Attending: Internal Medicine | Admitting: Internal Medicine

## 2012-01-12 ENCOUNTER — Telehealth: Payer: Self-pay | Admitting: *Deleted

## 2012-01-12 DIAGNOSIS — R922 Inconclusive mammogram: Secondary | ICD-10-CM

## 2012-01-12 NOTE — Telephone Encounter (Signed)
Left message for pt regarding her negative [place]

## 2012-01-13 ENCOUNTER — Ambulatory Visit: Payer: Federal, State, Local not specified - PPO | Admitting: Rehabilitation

## 2012-01-27 ENCOUNTER — Ambulatory Visit: Payer: Federal, State, Local not specified - PPO | Attending: Family Medicine | Admitting: Rehabilitation

## 2012-01-27 DIAGNOSIS — M545 Low back pain, unspecified: Secondary | ICD-10-CM | POA: Insufficient documentation

## 2012-01-27 DIAGNOSIS — IMO0001 Reserved for inherently not codable concepts without codable children: Secondary | ICD-10-CM | POA: Insufficient documentation

## 2012-01-28 ENCOUNTER — Ambulatory Visit (INDEPENDENT_AMBULATORY_CARE_PROVIDER_SITE_OTHER): Payer: Federal, State, Local not specified - PPO | Admitting: Family Medicine

## 2012-01-28 ENCOUNTER — Encounter: Payer: Self-pay | Admitting: Family Medicine

## 2012-01-28 ENCOUNTER — Ambulatory Visit (HOSPITAL_BASED_OUTPATIENT_CLINIC_OR_DEPARTMENT_OTHER)
Admission: RE | Admit: 2012-01-28 | Discharge: 2012-01-28 | Disposition: A | Discharge status: Home / Self Care | Payer: Federal, State, Local not specified - PPO | Source: Ambulatory Visit | Attending: Family Medicine | Admitting: Family Medicine

## 2012-01-28 VITALS — BP 119/80 | HR 62 | Ht 66.0 in | Wt 130.0 lb

## 2012-01-28 DIAGNOSIS — M545 Low back pain, unspecified: Secondary | ICD-10-CM | POA: Insufficient documentation

## 2012-01-28 MED ORDER — PREDNISONE 10 MG PO TABS: ORAL_TABLET | ORAL | Status: DC

## 2012-01-29 ENCOUNTER — Encounter: Payer: Self-pay | Admitting: Family Medicine

## 2012-01-29 NOTE — Assessment & Plan Note (Signed)
consistent with mild lumbar radiculopathy with symptoms persisting.  Went ahead with radiographs which show some mild disc calcification at L4-5 but otherwise normal.  She wants to try prednisone and continue home exercises for now.  If still not improving with this would go ahead with lumbar spine MRI.

## 2012-01-29 NOTE — Progress Notes (Signed)
Subjective:    Patient ID: Danielle Arnold, female    DOB: 1960/04/05, 52 y.o.   MRN: [id number]  PCP: Dr. Constance Goltz  HPI  52 yo F here for f/u low back pain  11/6: Patient states since June she has had left greater than right low back pain radiating into groin/anterior thigh area. No known injury - she fell in September but this was after she'd already had the pain. Had been seeing chiropractor without much help. Likes to hike for exercise - seems worse when she does this (esp after 13 mile hike recently). History of sciatica. Tried ibuprofen though this was just recently prescribed. No numbness/tingling. No bowel/bladder dysfunction.  01/28/12:  Patient has done extensive PT and home exercises since last visit. Still has a constant dull pain that goes into left hip and leg. Numbness too going into left leg. Sitting a lot at work and long walks bother her symptoms the most. Has started retaking the nabumetone. PT suggested she get further imaging given she has continued to struggle with symptoms.  Past Medical History  Diagnosis Date  . Menopausal hot flushes   . Abnormal Pap smear and cervical HPV (human papillomavirus) 1989    Cone biopsy  ????dysplasia  . Anxiety     mild on no meds    Current Outpatient Prescriptions on File Prior to Visit  Medication Sig Dispense Refill  . cholecalciferol (VITAMIN D) 1000 UNITS tablet Take 400 Units by mouth daily.       . cyclobenzaprine (FLEXERIL) 5 MG tablet Take 1 tablet (5 mg total) by mouth 3 (three) times daily as needed for muscle spasms.  30 tablet  1  . nabumetone (RELAFEN) 500 MG tablet Take 1 tablet (500 mg total) by mouth 2 (two) times daily.  30 tablet  1  . nystatin-triamcinolone ointment (MYCOLOG) Apply topically 2 (two) times daily.  30 g  0  . Probiotic Product (SOLUBLE FIBER/PROBIOTICS PO) Take by mouth as needed.        . vitamin B-12 (CYANOCOBALAMIN) 100 MCG tablet Take 50 mcg by mouth daily.        . vitamin  E 400 UNIT capsule Take 400 Units by mouth daily.        Marland Kitchen zinc gluconate 50 MG tablet Take 50 mg by mouth daily.          Past Surgical History  Procedure Date  . Dilation and curettage of uterus 3/11  . Uterine fibroid embolization 2003  . Myomectomy 2000  . Cervical cone biopsy 89  . Ear operation 89 67 68    ear drum repair  . Appendectomy 80    Allergies  Allergen Reactions  . Penicillins Rash  . Percocet (Oxycodone-Acetaminophen) Itching    History   Social History  . Marital Status: Single    Spouse Name: N/A    Number of Children: N/A  . Years of Education: N/A   Occupational History  . Not on file.   Social History Main Topics  . Smoking status: Never Smoker   . Smokeless tobacco: Never Used  . Alcohol Use: Yes     Comment: glass of wine 3-4 times per week  . Drug Use: No  . Sexually Active: No   Other Topics Concern  . Not on file   Social History Narrative  . No narrative on file    Family History  Problem Relation Age of Onset  . Cancer Sister     breast  premenopausal  .  Cancer Maternal Grandmother     breast  . Sudden death Neg Hx   . Hypertension Neg Hx   . Heart attack Neg Hx   . Diabetes Neg Hx   . Hyperlipidemia Neg Hx     BP 119/80  Pulse 62  Ht 5\' 6"  (1.676 m)  Wt 130 lb (58.968 kg)  BMI 20.98 kg/m2  Review of Systems  See HPI above.    Objective:   Physical Exam  Gen: NAD  Back: No gross deformity, scoliosis. No focal TTP paraspinal muscles.  No midline or bony TTP. FROM without pain. Strength LEs 5/5 all muscle groups.   2+ MSRs in patellar and achilles tendons, equal bilaterally. Negative SLRs. Sensation intact to light touch bilaterally. Negative logroll bilateral hips Negative fabers and piriformis stretches.    Assessment & Plan:  1. Low back pain - consistent with mild lumbar radiculopathy with symptoms persisting.  Went ahead with radiographs which show some mild disc calcification at L4-5 but otherwise  normal.  She wants to try prednisone and continue home exercises for now.  If still not improving with this would go ahead with lumbar spine MRI.

## 2012-02-10 ENCOUNTER — Ambulatory Visit: Payer: Federal, State, Local not specified - PPO | Admitting: Rehabilitation

## 2012-10-13 ENCOUNTER — Ambulatory Visit (HOSPITAL_BASED_OUTPATIENT_CLINIC_OR_DEPARTMENT_OTHER)
Admission: RE | Admit: 2012-10-13 | Discharge: 2012-10-13 | Disposition: A | Discharge status: Home / Self Care | Payer: Federal, State, Local not specified - PPO | Source: Ambulatory Visit | Attending: Internal Medicine | Admitting: Internal Medicine

## 2012-10-13 ENCOUNTER — Other Ambulatory Visit: Payer: Self-pay | Admitting: Internal Medicine

## 2012-10-13 ENCOUNTER — Ambulatory Visit (INDEPENDENT_AMBULATORY_CARE_PROVIDER_SITE_OTHER): Payer: Federal, State, Local not specified - PPO | Admitting: Internal Medicine

## 2012-10-13 ENCOUNTER — Encounter: Payer: Self-pay | Admitting: Internal Medicine

## 2012-10-13 VITALS — BP 119/75 | HR 60 | Temp 97.3°F | Resp 18 | Wt 129.0 lb

## 2012-10-13 DIAGNOSIS — R102 Pelvic and perineal pain: Secondary | ICD-10-CM

## 2012-10-13 DIAGNOSIS — N390 Urinary tract infection, site not specified: Secondary | ICD-10-CM

## 2012-10-13 DIAGNOSIS — N949 Unspecified condition associated with female genital organs and menstrual cycle: Secondary | ICD-10-CM | POA: Insufficient documentation

## 2012-10-13 DIAGNOSIS — R109 Unspecified abdominal pain: Secondary | ICD-10-CM

## 2012-10-13 LAB — AMYLASE: Amylase: 73 U/L (ref 0–105)

## 2012-10-13 LAB — COMPREHENSIVE METABOLIC PANEL
ALT: 23 U/L (ref 0–35)
Alkaline Phosphatase: 81 U/L (ref 39–117)
Sodium: 137 mEq/L (ref 135–145)
Total Bilirubin: 0.5 mg/dL (ref 0.3–1.2)
Total Protein: 7.4 g/dL (ref 6.0–8.3)

## 2012-10-13 LAB — POCT URINALYSIS DIPSTICK
Glucose, UA: NEGATIVE
Nitrite, UA: NEGATIVE
Protein, UA: NEGATIVE
Spec Grav, UA: 1.01
Urobilinogen, UA: NEGATIVE
pH, UA: 6.5

## 2012-10-13 LAB — CBC WITH DIFFERENTIAL/PLATELET
Basophils Absolute: 0 10*3/uL (ref 0.0–0.1)
Basophils Relative: 1 % (ref 0–1)
Hemoglobin: 14.3 g/dL (ref 12.0–15.0)
Lymphocytes Relative: 23 % (ref 12–46)
MCH: 29.4 pg (ref 26.0–34.0)
MCHC: 33.6 g/dL (ref 30.0–36.0)
Monocytes Relative: 8 % (ref 3–12)
Neutro Abs: 4.1 10*3/uL (ref 1.7–7.7)
Neutrophils Relative %: 67 % (ref 43–77)
Platelets: 299 10*3/uL (ref 150–400)
WBC: 6.1 10*3/uL (ref 4.0–10.5)

## 2012-10-13 MED ORDER — CIPROFLOXACIN HCL 500 MG PO TABS: 500.0000 mg | ORAL_TABLET | Freq: Two times a day (BID) | ORAL | Status: DC

## 2012-10-13 NOTE — Patient Instructions (Addendum)
Call office on Thursday for results

## 2012-10-13 NOTE — Progress Notes (Signed)
Subjective:    Patient ID: Danielle Arnold, female    DOB: March 24, 1960, 52 y.o.   MRN: [id number]  HPI Danielle Arnold is here for acute visit.    She reports sharp stabbing RLQ pain that began yesterday and pesisted throughout the night.  Lasted 1-2 minutes.  Still uncomfortable today but less frequent today.  No N/V/D  No vaginal discharge or bleeding.  She is S/P appendectomy.    Mild urinary dysuria but no urgency or frequency   No documented fever  Allergies  Allergen Reactions  . Penicillins Rash  . Percocet [Oxycodone-Acetaminophen] Itching   Past Medical History  Diagnosis Date  . Menopausal hot flushes   . Abnormal Pap smear and cervical HPV (human papillomavirus) 1989    Cone biopsy  ????dysplasia  . Anxiety     mild on no meds   Past Surgical History  Procedure Laterality Date  . Dilation and curettage of uterus  3/11  . Uterine fibroid embolization  2003  . Myomectomy  2000  . Cervical cone biopsy  89  . Ear operation  89 67 68    ear drum repair  . Appendectomy  80   History   Social History  . Marital Status: Single    Spouse Name: N/A    Number of Children: N/A  . Years of Education: N/A   Occupational History  . Not on file.   Social History Main Topics  . Smoking status: Never Smoker   . Smokeless tobacco: Never Used  . Alcohol Use: Yes     Comment: glass of wine 3-4 times per week  . Drug Use: No  . Sexual Activity: No   Other Topics Concern  . Not on file   Social History Narrative  . No narrative on file   Family History  Problem Relation Age of Onset  . Cancer Sister     breast  premenopausal  . Cancer Maternal Grandmother     breast  . Sudden death Neg Hx   . Hypertension Neg Hx   . Heart attack Neg Hx   . Diabetes Neg Hx   . Hyperlipidemia Neg Hx    Patient Active Problem List   Diagnosis Date Noted  . Hyperlipidemia 12/31/2011  . Tinea 12/25/2011  . Constipation 12/25/2011  . Vaginal dryness 12/25/2011  . Low back pain  11/27/2011  . Sciatica 11/18/2011  . Plantar fasciitis 07/06/2011  . Heel spur 07/06/2011  . Left leg injury 02/10/2011  . Dermatitis 02/10/2011  . Anemia 11/21/2010  . Irritable bowel 11/21/2010  . FH: breast cancer in first degree relative 10/31/2010  . Anxiety   . Menopausal hot flushes    Current Outpatient Prescriptions on File Prior to Visit  Medication Sig Dispense Refill  . cholecalciferol (VITAMIN D) 1000 UNITS tablet Take 400 Units by mouth daily.       . Probiotic Product (SOLUBLE FIBER/PROBIOTICS PO) Take by mouth as needed.       . vitamin B-12 (CYANOCOBALAMIN) 100 MCG tablet Take 50 mcg by mouth daily.         No current facility-administered medications on file prior to visit.         Review of Systems See HPI    Objective:   Physical Exam  Physical Exam  Nursing note and vitals reviewed.  Constitutional: She is oriented to person, place, and time. She appears well-developed and well-nourished.  HENT:  Head: Normocephalic and atraumatic.  Cardiovascular: Normal rate and regular rhythm.  Exam reveals no gallop and no friction rub.  No murmur heard.  Pulmonary/Chest: Breath sounds normal. She has no wheezes. She has no rales. ABd:  BS pos No hsm  Tender to deep palpation in r lower pelvic area.  No rebound  No peritoneal signs. Rectal  No mass guaiac neg  Neurological: She is alert and oriented to person, place, and time.  Skin: Skin is warm and dry.  Psychiatric: She has a normal mood and affect. Her behavior is normal.             Assessment & Plan:  RLQ/Pelvic pain :   U/A post for LE and trace blood.  Pt repeatedly declines taking antibiotic  Will send for culture.  Schedule pelviic/TVUS today  Further management based on results Check CBC, amylase, chemistries today  Poss uti  Will send for culture    Pt advised to call office in am for results and furhter management

## 2012-10-14 ENCOUNTER — Encounter: Payer: Self-pay | Admitting: *Deleted

## 2012-10-14 ENCOUNTER — Telehealth: Payer: Self-pay | Admitting: *Deleted

## 2012-10-14 NOTE — Telephone Encounter (Signed)
Message copied by Mathews Robinsons on Thu Oct 14, 2012  4:27 PM ------      Message from: Raechel Chute D      Created: Thu Oct 14, 2012 11:56 AM       Ok to mail to pt ------

## 2012-10-14 NOTE — Telephone Encounter (Signed)
Spoke with pt and informed of u/S results  She has had no further pain    Await urine culture results  Advised pt to make appt to see me next week as I want to check another urine speciment  Bobbie call pt on her cell and give 30 min appt for next week

## 2012-10-14 NOTE — Telephone Encounter (Signed)
Danielle Arnold called for her results

## 2012-10-14 NOTE — Telephone Encounter (Signed)
LVM message for pt to return call regarding need for F/U appt

## 2012-10-15 ENCOUNTER — Other Ambulatory Visit: Payer: Self-pay | Admitting: *Deleted

## 2012-10-15 ENCOUNTER — Telehealth: Payer: Self-pay | Admitting: *Deleted

## 2012-10-15 DIAGNOSIS — N390 Urinary tract infection, site not specified: Secondary | ICD-10-CM

## 2012-10-15 LAB — URINE CULTURE
Colony Count: NO GROWTH
Organism ID, Bacteria: NO GROWTH

## 2012-10-15 MED ORDER — NITROFURANTOIN MONOHYD MACRO 100 MG PO CAPS: 100.0000 mg | ORAL_CAPSULE | Freq: Two times a day (BID) | ORAL | Status: DC

## 2012-10-18 ENCOUNTER — Telehealth: Payer: Self-pay | Admitting: *Deleted

## 2012-10-18 ENCOUNTER — Encounter: Payer: Self-pay | Admitting: *Deleted

## 2012-10-18 NOTE — Telephone Encounter (Signed)
Message copied by Mathews Robinsons on Mon Oct 18, 2012  2:06 PM ------      Message from: Raechel Chute D      Created: Mon Oct 18, 2012  8:00 AM       Karen Kitchens  Tell her that she can stop taking the antibiotic ------

## 2012-10-18 NOTE — Telephone Encounter (Signed)
Pt states that she only took two of the abx because of tingling in her feet states that she is feeling better and does not wish to schedule a follow up appt. Since she is feeling better. A (she does not want to pay another $ 25) Advised pt that a follow up urine is recommended.

## 2012-10-21 NOTE — Telephone Encounter (Signed)
Duplicate

## 2012-12-21 ENCOUNTER — Other Ambulatory Visit: Payer: Self-pay | Admitting: *Deleted

## 2012-12-21 DIAGNOSIS — N951 Menopausal and female climacteric states: Secondary | ICD-10-CM

## 2012-12-21 DIAGNOSIS — Z139 Encounter for screening, unspecified: Secondary | ICD-10-CM

## 2012-12-21 DIAGNOSIS — E785 Hyperlipidemia, unspecified: Secondary | ICD-10-CM

## 2012-12-21 DIAGNOSIS — D649 Anemia, unspecified: Secondary | ICD-10-CM

## 2012-12-21 LAB — CBC WITH DIFFERENTIAL/PLATELET
Basophils Relative: 1 % (ref 0–1)
HCT: 42.1 % (ref 36.0–46.0)
Hemoglobin: 14.1 g/dL (ref 12.0–15.0)
Lymphocytes Relative: 29 % (ref 12–46)
Lymphs Abs: 1.2 10*3/uL (ref 0.7–4.0)
Monocytes Absolute: 0.4 10*3/uL (ref 0.1–1.0)
Monocytes Relative: 9 % (ref 3–12)
Neutro Abs: 2.6 10*3/uL (ref 1.7–7.7)
Neutrophils Relative %: 60 % (ref 43–77)
RBC: 4.88 MIL/uL (ref 3.87–5.11)
WBC: 4.3 10*3/uL (ref 4.0–10.5)

## 2012-12-21 LAB — COMPREHENSIVE METABOLIC PANEL
AST: 20 U/L (ref 0–37)
Albumin: 4.2 g/dL (ref 3.5–5.2)
BUN: 11 mg/dL (ref 6–23)
Calcium: 9.5 mg/dL (ref 8.4–10.5)
Chloride: 101 mEq/L (ref 96–112)
Creat: 0.85 mg/dL (ref 0.50–1.10)
Glucose, Bld: 85 mg/dL (ref 70–99)
Potassium: 3.9 mEq/L (ref 3.5–5.3)
Sodium: 138 mEq/L (ref 135–145)

## 2012-12-21 LAB — LIPID PANEL
Cholesterol: 206 mg/dL — ABNORMAL HIGH (ref 0–200)
HDL: 76 mg/dL (ref >39)
Total CHOL/HDL Ratio: 2.7 Ratio
Triglycerides: 67 mg/dL (ref <150)
VLDL: 13 mg/dL (ref 0–40)

## 2012-12-21 LAB — TSH: TSH: 2.592 u[IU]/mL (ref 0.350–4.500)

## 2012-12-23 ENCOUNTER — Ambulatory Visit (INDEPENDENT_AMBULATORY_CARE_PROVIDER_SITE_OTHER): Payer: Federal, State, Local not specified - PPO | Admitting: Sports Medicine

## 2012-12-23 ENCOUNTER — Encounter: Payer: Self-pay | Admitting: *Deleted

## 2012-12-23 ENCOUNTER — Encounter: Payer: Self-pay | Admitting: Sports Medicine

## 2012-12-23 VITALS — BP 103/58 | HR 75 | Wt 132.0 lb

## 2012-12-23 DIAGNOSIS — IMO0002 Reserved for concepts with insufficient information to code with codable children: Secondary | ICD-10-CM

## 2012-12-23 DIAGNOSIS — M5416 Radiculopathy, lumbar region: Secondary | ICD-10-CM

## 2012-12-23 MED ORDER — PREDNISONE 50 MG PO TABS: ORAL_TABLET | ORAL | Status: DC

## 2012-12-23 MED ORDER — CYCLOBENZAPRINE HCL 10 MG PO TABS: ORAL_TABLET | ORAL | Status: DC

## 2012-12-23 MED ORDER — MELOXICAM 15 MG PO TABS: ORAL_TABLET | ORAL | Status: DC

## 2012-12-23 NOTE — Progress Notes (Signed)
Patient ID: Danielle Arnold, female   DOB: [birth year], 53 y.o.   MRN: [id number]  Subjective:    CC: Back Pain   HPI: This is a 52 year old female who is here with a complaint of back pain. She reports that pain started around December 2013. She saw a doctor at that time who prescribed some prednisone. Her x-ray at that time showed mild disc pathology of the L4/L5. She reports that the pain got better for a while but has returned. Pain is worse sitting down for a long time and during long car rides. She denies any loss of bladder or bowel function. She is an avid hiker who hikes for about 4-6 hours every weekend. The pain is distributed along the L4/L5 dermatome.   Past medical history, Surgical history, Family history not pertinant except as noted below, Social history, Allergies, and medications have been entered into the medical record, reviewed, and no changes needed.   Review of Systems: No headache, visual changes, nausea, vomiting, diarrhea, constipation, dizziness, abdominal pain, skin rash, fevers, chills, night sweats, swollen lymph nodes, weight loss, chest pain, body aches, joint swelling, muscle aches, shortness of breath, mood changes, visual or auditory hallucinations.  Objective:    General: Well Developed, well nourished, and in no acute distress.  Neuro: Alert and oriented x3, extra-ocular muscles intact, sensation grossly intact.  HEENT: Normocephalic, atraumatic, pupils equal round reactive to light, neck supple, no masses, no lymphadenopathy, thyroid nonpalpable.  Skin: Warm and dry, no rashes noted.  Cardiac: Regular rate and rhythm, no murmurs rubs or gallops.  Respiratory: Clear to auscultation bilaterally. Not using accessory muscles, speaking in full sentences.  Abdominal: Soft, nontender, nondistended, positive bowel sounds, no masses, no organomegaly.  Back Exam:  Inspection: Unremarkable  Motion: Flexion 45 deg, Extension 45 deg, Side Bending to 45 deg bilaterally,   Rotation to 45 deg bilaterally  SLR laying: Negative  XSLR laying: Negative  Palpable tenderness: None. FABER: negative. Sensory change: Gross sensation intact to all lumbar and sacral dermatomes.  Reflexes: 2+ at both patellar tendons, 2+ at achilles tendons, Babinski's downgoing.  Strength at foot  Plantar-flexion: 5/5 Dorsi-flexion: 5/5 Eversion: 5/5 Inversion: 5/5  Leg strength  Quad: 5/5 Hamstring: 5/5 Hip flexor: 5/5 Hip abductors: 5/5  Gait unremarkable.  X-rays were reviewed personally, there is L4-L5 degenerative changes.  Impression and Recommendations:    Assessment:  This is a 52 year old female who is here with a complaint of back pain.  Plan:  Back pain is consistent with a herniated disk. We will start with conservative treatment 1) Mobic 2) Home exercises  3) 5 day course of oral prednisone  She will make an appointment for custom orthotics for the next available spot.  The patient was counselled, risk factors were discussed, anticipatory guidance given.

## 2012-12-23 NOTE — Assessment & Plan Note (Signed)
With L4-L5 degenerative changes on x-ray. Home rehabilitation, Mobic, prednisone, Flexeril at bedtime. Return in 4 weeks, consider formal physical therapy if no better. If continues to persist we can pursue MRI for interventional injection planning would likely be a left-sided intralaminar at the L4-L5 level.

## 2012-12-27 ENCOUNTER — Ambulatory Visit (INDEPENDENT_AMBULATORY_CARE_PROVIDER_SITE_OTHER): Payer: Federal, State, Local not specified - PPO | Admitting: Internal Medicine

## 2012-12-27 ENCOUNTER — Ambulatory Visit (INDEPENDENT_AMBULATORY_CARE_PROVIDER_SITE_OTHER): Payer: Federal, State, Local not specified - PPO | Admitting: Sports Medicine

## 2012-12-27 ENCOUNTER — Encounter: Payer: Self-pay | Admitting: Internal Medicine

## 2012-12-27 ENCOUNTER — Encounter: Payer: Self-pay | Admitting: Sports Medicine

## 2012-12-27 VITALS — BP 103/67 | HR 73 | Wt 129.0 lb

## 2012-12-27 VITALS — BP 118/71 | HR 62 | Resp 18 | Wt 129.0 lb

## 2012-12-27 DIAGNOSIS — M5416 Radiculopathy, lumbar region: Secondary | ICD-10-CM

## 2012-12-27 DIAGNOSIS — M722 Plantar fascial fibromatosis: Secondary | ICD-10-CM

## 2012-12-27 DIAGNOSIS — Z0000 Encounter for general adult medical examination without abnormal findings: Secondary | ICD-10-CM

## 2012-12-27 DIAGNOSIS — IMO0002 Reserved for concepts with insufficient information to code with codable children: Secondary | ICD-10-CM

## 2012-12-27 DIAGNOSIS — Z124 Encounter for screening for malignant neoplasm of cervix: Secondary | ICD-10-CM

## 2012-12-27 DIAGNOSIS — Z1151 Encounter for screening for human papillomavirus (HPV): Secondary | ICD-10-CM

## 2012-12-27 DIAGNOSIS — E785 Hyperlipidemia, unspecified: Secondary | ICD-10-CM

## 2012-12-27 LAB — POCT URINALYSIS DIPSTICK
Bilirubin, UA: NEGATIVE
Glucose, UA: NEGATIVE
Ketones, UA: NEGATIVE
Leukocytes, UA: NEGATIVE
Spec Grav, UA: 1.01
pH, UA: 6.5

## 2012-12-27 LAB — HEMOCCULT GUIAC POC 1CARD (OFFICE)

## 2012-12-27 NOTE — Assessment & Plan Note (Signed)
Continue home rehabilitation as above. She will call us if she decides to switch to formal PT.

## 2012-12-27 NOTE — Patient Instructions (Signed)
Call GI MD for appointment. ' Set up appointment for a 3D mammogram at the [facility]

## 2012-12-27 NOTE — Assessment & Plan Note (Signed)
Custom orthotics as above. 

## 2012-12-27 NOTE — Progress Notes (Signed)
Subjective:    Patient ID: Danielle Arnold, female    DOB: [birth year], 52 y.o.   MRN: [id number]  HPI  Danielle Arnold is here for CPE.  Hyperlipidemia  She has been watching her diet closely  .  Current labs improved  She reports flare up of her back sciatic discomfort.  Was evaluated in [place]  And placed on 5 day course of prednisone.  She is feeling better today  FH of breast ca -  She is advised BRCA but declines now  She reports a change in caliber of stool.   No blood no change in color.   She does have constipation off and on.  [place]  She reports no polyps  She reports she did have an abnormal pap years ago while [place] state.  She had a [facility] biopsy and after that all paps have been normal  Extreme breast density :  Advised 3D mm   Discussed BRCA testing but pt "not ready now"  Allergies  Allergen Reactions  . Penicillins Rash  . Percocet [Oxycodone-Acetaminophen] Itching   Past Medical History  Diagnosis Date  . Menopausal hot flushes   . Abnormal Pap smear and cervical HPV (human papillomavirus) 1989    [facility] biopsy  ????dysplasia  . Anxiety     mild on no meds   Past Surgical History  Procedure Laterality Date  . Dilation and curettage of uterus  3/11  . Uterine fibroid embolization  2003  . Myomectomy  2000  . Cervical cone biopsy  89  . Ear operation  89 67 68    ear drum repair  . Appendectomy  80   History   Social History  . Marital Status: Single    Spouse Name: N/A    Number of Children: N/A  . Years of Education: N/A   Occupational History  . Not on file.   Social History Main Topics  . Smoking status: Never Smoker   . Smokeless tobacco: Never Used  . Alcohol Use: Yes     Comment: glass of wine 3-4 times per week  . Drug Use: No  . Sexual Activity: No   Other Topics Concern  . Not on file   Social History Narrative  . No narrative on file   Family History  Problem Relation Age of Onset  . Cancer  Sister     breast  premenopausal  . Cancer Maternal Grandmother     breast  . Sudden death Neg Hx   . Hypertension Neg Hx   . Heart attack Neg Hx   . Hyperlipidemia Neg Hx   . Diabetes Mother    Patient Active Problem List   Diagnosis Date Noted  . Hyperlipidemia 12/31/2011  . Tinea 12/25/2011  . Constipation 12/25/2011  . Vaginal dryness 12/25/2011  . Left lumbar radiculitis 11/27/2011  . Sciatica 11/18/2011  . Plantar fasciitis 07/06/2011  . Heel spur 07/06/2011  . Left leg injury 02/10/2011  . Dermatitis 02/10/2011  . Anemia 11/21/2010  . Irritable bowel 11/21/2010  . FH: breast cancer in first degree relative 10/31/2010  . Anxiety   . Menopausal hot flushes    Current Outpatient Prescriptions on File Prior to Visit  Medication Sig Dispense Refill  . cholecalciferol (VITAMIN D) 1000 UNITS tablet Take 400 Units by mouth daily.       . cyclobenzaprine (FLEXERIL) 10 MG tablet One half tab PO qHS, then increase gradually to one tab TID.  30 tablet  0  . fish oil-omega-3 fatty acids 1000 MG capsule Take 2 g by mouth daily.      . predniSONE (DELTASONE) 50 MG tablet One tab PO daily for 5 days.  5 tablet  0  . Probiotic Product (SOLUBLE FIBER/PROBIOTICS PO) Take by mouth as needed.       . meloxicam (MOBIC) 15 MG tablet One tab PO qAM with breakfast for 2 weeks, then daily prn pain.  30 tablet  3  . vitamin B-12 (CYANOCOBALAMIN) 100 MCG tablet Take 50 mcg by mouth daily.         No current facility-administered medications on file prior to visit.      Review of Systems  Respiratory: Negative for chest tightness and shortness of breath.   Cardiovascular: Negative for chest pain, palpitations and leg swelling.  Gastrointestinal: Positive for constipation. Negative for nausea, diarrhea, blood in stool and anal bleeding.  All other systems reviewed and are negative.       Objective:   Physical Exam Physical Exam  Vital signs and nursing note reviewed  Constitutional:  She is oriented to person, place, and time. She appears well-developed and well-nourished. She is cooperative.  HENT:  Head: Normocephalic and atraumatic.  Right Ear: Tympanic membrane normal.  Left Ear: Tympanic membrane normal.  Nose: Nose normal.  Mouth/Throat: Oropharynx is clear and moist and mucous membranes are normal. No oropharyngeal exudate or posterior oropharyngeal erythema.  Eyes: Conjunctivae and EOM are normal. Pupils are equal, round, and reactive to light.  Neck: Neck supple. No JVD present. Carotid bruit is not present. No mass and no thyromegaly present.  Cardiovascular: Regular rhythm, normal heart sounds, intact distal pulses and normal pulses.  Exam reveals no gallop and no friction rub.   No murmur heard. Pulses:      Dorsalis pedis pulses are 2+ on the right side, and 2+ on the left side.  Pulmonary/Chest: Breath sounds normal. She has no wheezes. She has no rhonchi. She has no rales. Right breast exhibits no mass, no nipple discharge and no skin change. Left breast exhibits no mass, no nipple discharge and no skin change.  Abdominal: Soft. Bowel sounds are normal. She exhibits no distension and no mass. There is no hepatosplenomegaly. There is no tenderness. There is no CVA tenderness.  Genitourinary: Rectum normal, vagina normal and uterus normal. Rectal exam shows no mass. Guaiac negative stool. No labial fusion. There is no lesion on the right labia. There is no lesion on the left labia. Cervix exhibits no motion tenderness. Right adnexum displays no mass, no tenderness and no fullness. Left adnexum displays no mass, no tenderness and no fullness. No erythema around the vagina.  Musculoskeletal:       No active synovitis to any joint.    Lymphadenopathy:       Right cervical: No superficial cervical adenopathy present.      Left cervical: No superficial cervical adenopathy present.       Right axillary: No pectoral and no lateral adenopathy present.       Left  axillary: No pectoral and no lateral adenopathy present.      Right: No inguinal adenopathy present.       Left: No inguinal adenopathy present.  Neurological: She is alert and oriented to person, place, and time. She has normal strength and normal reflexes. No cranial nerve deficit or sensory deficit. She displays a negative Romberg sign. Coordination and gait normal.  Skin: Skin is warm and  dry. No abrasion, no bruising, no ecchymosis and no rash noted. No cyanosis. Nails show no clubbing.  Psychiatric: She has a normal mood and affect. Her speech is normal and behavior is normal.          Assessment & Plan:  Health Maintenance:  Declines flu,  Advised 3D mm at breast center,    Hyperlipidemia:  Improved DASH diet given  Extreme breast density/Sister Breast CA  Advised further imaging with U/S or MRI but most insurance will not pay.  Will get 3D mm .  She declines BRCA testing now  Lumbar radiculitis  On prednisone taper  Subjective change in caliber of stool.  She has  A GI [place] in [place]   Advised to call their office for appt/.        Assessment & Plan:

## 2012-12-27 NOTE — Progress Notes (Signed)
    Patient was fitted for a : standard, cushioned, semi-rigid orthotic. The orthotic was heated and afterward the patient stood on the orthotic blank positioned on the orthotic stand. The patient was positioned in subtalar neutral position and 10 degrees of ankle dorsiflexion in a weight bearing stance. After completion of molding, a stable base was applied to the orthotic blank. The blank was ground to a stable position for weight bearing. Size: 7 Base: Blue EVA Additional Posting and Padding: None The patient ambulated these, and they were very comfortable.  I spent 40 minutes with this patient, greater than 50% was face-to-face time counseling regarding the below diagnosis.   

## 2012-12-29 ENCOUNTER — Encounter: Payer: Self-pay | Admitting: Internal Medicine

## 2012-12-30 ENCOUNTER — Other Ambulatory Visit: Payer: Self-pay | Admitting: Internal Medicine

## 2012-12-30 DIAGNOSIS — Z1231 Encounter for screening mammogram for malignant neoplasm of breast: Secondary | ICD-10-CM

## 2013-01-20 ENCOUNTER — Encounter: Payer: Self-pay | Admitting: Sports Medicine

## 2013-01-20 ENCOUNTER — Ambulatory Visit (INDEPENDENT_AMBULATORY_CARE_PROVIDER_SITE_OTHER): Payer: Federal, State, Local not specified - PPO | Admitting: Sports Medicine

## 2013-01-20 VITALS — BP 112/68 | HR 67 | Wt 131.0 lb

## 2013-01-20 DIAGNOSIS — M5416 Radiculopathy, lumbar region: Secondary | ICD-10-CM

## 2013-01-20 DIAGNOSIS — IMO0002 Reserved for concepts with insufficient information to code with codable children: Secondary | ICD-10-CM

## 2013-01-20 DIAGNOSIS — M722 Plantar fascial fibromatosis: Secondary | ICD-10-CM

## 2013-01-20 NOTE — Assessment & Plan Note (Signed)
80% improved with core rehabilitation and other conservative measures. She will continue to work on her posture at work, I have also recommended that she get a yoga ball as a seat. She can return to see me on an as-needed basis, if she continues to have a recurrence of pain she is amenable to doing formal physical therapy.

## 2013-01-20 NOTE — Progress Notes (Signed)
  Subjective:    CC: Followup  HPI: Left lumbar radiculopathy: This pleasant 53 year old female comes back, for the past month she has done conservative measures including home rehabilitation exercises for her left-sided L4-L5 degenerative disc disease. She returns today 70-80% improved, and wondering what else can be done conservatively. She is overall very happy with her results.  Past medical history, Surgical history, Family history not pertinant except as noted below, Social history, Allergies, and medications have been entered into the medical record, reviewed, and no changes needed.   Review of Systems: No fevers, chills, night sweats, weight loss, chest pain, or shortness of breath.   Objective:    General: Well Developed, well nourished, and in no acute distress.  Neuro: Alert and oriented x3, extra-ocular muscles intact, sensation grossly intact.  HEENT: Normocephalic, atraumatic, pupils equal round reactive to light, neck supple, no masses, no lymphadenopathy, thyroid nonpalpable.  Skin: Warm and dry, no rashes. Cardiac: Regular rate and rhythm, no murmurs rubs or gallops, no lower extremity edema.  Respiratory: Clear to auscultation bilaterally. Not using accessory muscles, speaking in full sentences. Back Exam:  Inspection: Unremarkable  Motion: Flexion 45 deg, Extension 45 deg, Side Bending to 45 deg bilaterally,  Rotation to 45 deg bilaterally  SLR laying: Negative  XSLR laying: Negative  Palpable tenderness: None. FABER: negative. Sensory change: Gross sensation intact to all lumbar and sacral dermatomes.  Reflexes: 2+ at both patellar tendons, 2+ at achilles tendons, Babinski's downgoing.  Strength at foot  Plantar-flexion: 5/5 Dorsi-flexion: 5/5 Eversion: 5/5 Inversion: 5/5  Leg strength  Quad: 5/5 Hamstring: 5/5 Hip flexor: 5/5 Hip abductors: 5/5  Gait unremarkable.  Impression and Recommendations:

## 2013-01-20 NOTE — Assessment & Plan Note (Signed)
Will custom orthotics, she can come back for another set if needed.

## 2013-02-02 ENCOUNTER — Ambulatory Visit
Admission: RE | Admit: 2013-02-02 | Discharge: 2013-02-02 | Disposition: A | Discharge status: Home / Self Care | Payer: Federal, State, Local not specified - PPO | Source: Ambulatory Visit | Attending: Internal Medicine | Admitting: Internal Medicine

## 2013-02-02 DIAGNOSIS — Z1231 Encounter for screening mammogram for malignant neoplasm of breast: Secondary | ICD-10-CM

## 2013-03-03 ENCOUNTER — Telehealth: Payer: Self-pay

## 2013-03-03 DIAGNOSIS — M545 Low back pain, unspecified: Secondary | ICD-10-CM

## 2013-03-03 NOTE — Telephone Encounter (Signed)
Patient called requesting to start PT  For her back she wants to know if she can get an order without seeing Dr. Darene Lamer or do she need to come in to be seen first. She stated that it was talked about during her last visit. Terez Montee,CMA

## 2013-03-03 NOTE — Telephone Encounter (Signed)
Pt.notified

## 2013-03-03 NOTE — Telephone Encounter (Signed)
Done

## 2013-03-09 ENCOUNTER — Ambulatory Visit (INDEPENDENT_AMBULATORY_CARE_PROVIDER_SITE_OTHER): Payer: Federal, State, Local not specified - PPO

## 2013-03-09 DIAGNOSIS — R293 Abnormal posture: Secondary | ICD-10-CM

## 2013-03-09 DIAGNOSIS — M545 Low back pain, unspecified: Secondary | ICD-10-CM

## 2013-03-09 DIAGNOSIS — M6281 Muscle weakness (generalized): Secondary | ICD-10-CM

## 2013-03-17 ENCOUNTER — Encounter (INDEPENDENT_AMBULATORY_CARE_PROVIDER_SITE_OTHER): Payer: Federal, State, Local not specified - PPO | Admitting: Physical Therapy

## 2013-03-17 DIAGNOSIS — R293 Abnormal posture: Secondary | ICD-10-CM

## 2013-03-17 DIAGNOSIS — M6281 Muscle weakness (generalized): Secondary | ICD-10-CM

## 2013-03-17 DIAGNOSIS — M545 Low back pain, unspecified: Secondary | ICD-10-CM

## 2013-03-23 ENCOUNTER — Encounter (INDEPENDENT_AMBULATORY_CARE_PROVIDER_SITE_OTHER): Payer: Federal, State, Local not specified - PPO | Admitting: Physical Therapy

## 2013-03-23 DIAGNOSIS — M545 Low back pain, unspecified: Secondary | ICD-10-CM

## 2013-03-23 DIAGNOSIS — IMO0002 Reserved for concepts with insufficient information to code with codable children: Secondary | ICD-10-CM

## 2013-03-23 DIAGNOSIS — R293 Abnormal posture: Secondary | ICD-10-CM

## 2013-03-23 DIAGNOSIS — M6281 Muscle weakness (generalized): Secondary | ICD-10-CM

## 2013-06-22 ENCOUNTER — Ambulatory Visit (INDEPENDENT_AMBULATORY_CARE_PROVIDER_SITE_OTHER): Payer: Federal, State, Local not specified - PPO | Admitting: Internal Medicine

## 2013-06-22 ENCOUNTER — Encounter: Payer: Self-pay | Admitting: Internal Medicine

## 2013-06-22 VITALS — BP 112/66 | HR 76 | Temp 98.3°F | Resp 17 | Ht 66.0 in | Wt 130.0 lb

## 2013-06-22 DIAGNOSIS — Z139 Encounter for screening, unspecified: Secondary | ICD-10-CM

## 2013-06-22 DIAGNOSIS — W57XXXA Bitten or stung by nonvenomous insect and other nonvenomous arthropods, initial encounter: Secondary | ICD-10-CM

## 2013-06-22 DIAGNOSIS — S1096XA Insect bite of unspecified part of neck, initial encounter: Secondary | ICD-10-CM

## 2013-06-22 DIAGNOSIS — R21 Rash and other nonspecific skin eruption: Secondary | ICD-10-CM

## 2013-06-22 DIAGNOSIS — S0096XA Insect bite (nonvenomous) of unspecified part of head, initial encounter: Secondary | ICD-10-CM

## 2013-06-22 MED ORDER — DOXYCYCLINE HYCLATE 50 MG PO CAPS: 50.0000 mg | ORAL_CAPSULE | Freq: Two times a day (BID) | ORAL | Status: DC

## 2013-06-22 MED ORDER — MOMETASONE FUROATE 0.1 % EX CREA: 1.0000 "application " | TOPICAL_CREAM | Freq: Every day | CUTANEOUS | Status: DC

## 2013-06-22 NOTE — Patient Instructions (Signed)
Schedule CPE

## 2013-06-22 NOTE — Progress Notes (Signed)
Subjective:    Patient ID: Danielle Arnold, female    DOB: 1960/01/23, 53 y.o.   MRN: [id number]  HPI  Danielle Arnold is here for acute visit  Removed tick from scalp 7-10 days ago .  Now maculopapular rash on both sides of chest .  No fever no headache no cough  Lots of itching  She has appt with dermatologist next week  Allergies  Allergen Reactions  . Penicillins Rash  . Percocet [Oxycodone-Acetaminophen] Itching   Past Medical History  Diagnosis Date  . Menopausal hot flushes   . Abnormal Pap smear and cervical HPV (human papillomavirus) 1989    Cone biopsy  ????dysplasia  . Anxiety     mild on no meds   Past Surgical History  Procedure Laterality Date  . Dilation and curettage of uterus  3/11  . Uterine fibroid embolization  2003  . Myomectomy  2000  . Cervical cone biopsy  89  . Ear operation  89 67 68    ear drum repair  . Appendectomy  80   History   Social History  . Marital Status: Single    Spouse Name: N/A    Number of Children: N/A  . Years of Education: N/A   Occupational History  . Not on file.   Social History Main Topics  . Smoking status: Never Smoker   . Smokeless tobacco: Never Used  . Alcohol Use: Yes     Comment: glass of wine 3-4 times per week  . Drug Use: No  . Sexual Activity: No   Other Topics Concern  . Not on file   Social History Narrative  . No narrative on file   Family History  Problem Relation Age of Onset  . Cancer Sister     breast  premenopausal  . Cancer Maternal Grandmother     breast  . Sudden death Neg Hx   . Hypertension Neg Hx   . Heart attack Neg Hx   . Hyperlipidemia Neg Hx   . Diabetes Mother    Patient Active Problem List   Diagnosis Date Noted  . Hyperlipidemia 12/31/2011  . Tinea 12/25/2011  . Constipation 12/25/2011  . Vaginal dryness 12/25/2011  . Left lumbar radiculitis 11/27/2011  . Sciatica 11/18/2011  . Plantar fasciitis 07/06/2011  . Heel spur 07/06/2011  . Left leg injury  02/10/2011  . Dermatitis 02/10/2011  . Anemia 11/21/2010  . Irritable bowel 11/21/2010  . FH: breast cancer in first degree relative 10/31/2010  . Anxiety   . Menopausal hot flushes    Current Outpatient Prescriptions on File Prior to Visit  Medication Sig Dispense Refill  . cholecalciferol (VITAMIN D) 1000 UNITS tablet Take 400 Units by mouth daily.       . fish oil-omega-3 fatty acids 1000 MG capsule Take 2 g by mouth daily.      . Probiotic Product (SOLUBLE FIBER/PROBIOTICS PO) Take by mouth as needed.       . vitamin B-12 (CYANOCOBALAMIN) 100 MCG tablet Take 50 mcg by mouth daily.         No current facility-administered medications on file prior to visit.      Review of Systems     Objective:   Physical Exam  Physical Exam  Nursing note and vitals reviewed.  Constitutional: She is oriented to person, place, and time. She appears well-developed and well-nourished.  HENT:  Head: Normocephalic and atraumatic.  Cardiovascular: Normal rate and regular rhythm. Exam reveals no gallop and  no friction rub.  No murmur heard.  Pulmonary/Chest: Breath sounds normal. She has no wheezes. She has no rales.  Neurological: She is alert and oriented to person, place, and time.  Skin: Skin is warm and dry.  She has 3 lesions on chest wall 2 on right side one on left side.  Small   maculopapular lesions Psychiatric: She has a normal mood and affect. Her behavior is normal.             Assessment & Plan:  Tick related rash  Will empirically cover with Doxycycline 100 mg bid for 10 days    Itching   Elocon  creme bid

## 2013-08-18 ENCOUNTER — Ambulatory Visit (INDEPENDENT_AMBULATORY_CARE_PROVIDER_SITE_OTHER): Payer: Federal, State, Local not specified - PPO | Admitting: Sports Medicine

## 2013-08-18 ENCOUNTER — Encounter: Payer: Self-pay | Admitting: Sports Medicine

## 2013-08-18 ENCOUNTER — Ambulatory Visit (INDEPENDENT_AMBULATORY_CARE_PROVIDER_SITE_OTHER): Payer: Federal, State, Local not specified - PPO

## 2013-08-18 VITALS — BP 129/73 | HR 54 | Ht 64.0 in | Wt 131.0 lb

## 2013-08-18 DIAGNOSIS — M160 Bilateral primary osteoarthritis of hip: Secondary | ICD-10-CM | POA: Insufficient documentation

## 2013-08-18 DIAGNOSIS — M5416 Radiculopathy, lumbar region: Secondary | ICD-10-CM

## 2013-08-18 DIAGNOSIS — M1612 Unilateral primary osteoarthritis, left hip: Secondary | ICD-10-CM

## 2013-08-18 DIAGNOSIS — M1610 Unilateral primary osteoarthritis, unspecified hip: Secondary | ICD-10-CM

## 2013-08-18 DIAGNOSIS — IMO0002 Reserved for concepts with insufficient information to code with codable children: Secondary | ICD-10-CM | POA: Diagnosis not present

## 2013-08-18 MED ORDER — MELOXICAM 15 MG PO TABS: ORAL_TABLET | ORAL | Status: DC

## 2013-08-18 NOTE — Progress Notes (Signed)
   Subjective:    I'm seeing this patient as a consultation for:  Dr. Coralyn Mark.  CC: Left hip pain  HPI: This is a very pleasant 53 year old female, for the past several months she's had pain which he localizes in the left groin with gelling, moderate, persistent without radiation. Denies any mechanical symptoms, no trauma.  Left lumbar radiculitis: Left-sided L4, we have treated this conservatively in the past and she did very well, symptoms are recurring but she desires to proceed again with conservative measures.  Past medical history, Surgical history, Family history not pertinant except as noted below, Social history, Allergies, and medications have been entered into the medical record, reviewed, and no changes needed.   Review of Systems: No headache, visual changes, nausea, vomiting, diarrhea, constipation, dizziness, abdominal pain, skin rash, fevers, chills, night sweats, weight loss, swollen lymph nodes, body aches, joint swelling, muscle aches, chest pain, shortness of breath, mood changes, visual or auditory hallucinations.   Objective:   General: Well Developed, well nourished, and in no acute distress.  Neuro/Psych: Alert and oriented x3, extra-ocular muscles intact, able to move all 4 extremities, sensation grossly intact. Skin: Warm and dry, no rashes noted.  Respiratory: Not using accessory muscles, speaking in full sentences, trachea midline.  Cardiovascular: Pulses palpable, no extremity edema. Abdomen: Does not appear distended. Left Hip: ROM IR: 60 Deg, ER: 60 Deg, Flexion: 120 Deg, Extension: 100 Deg, Abduction: 45 Deg, Adduction: 45 Deg, good internal rotation range of motion but reproduction of pain with internal rotation. Strength IR: 5/5, ER: 5/5, Flexion: 5/5, Extension: 5/5, Abduction: 5/5, Adduction: 5/5 Pelvic alignment unremarkable to inspection and palpation. Standing hip rotation and gait without trendelenburg / unsteadiness. Greater trochanter without  tenderness to palpation. No tenderness over piriformis. No SI joint tenderness and normal minimal SI movement.   X-ray show mild bilateral hip osteoarthritis.  Impression and Recommendations:   This case required medical decision making of moderate complexity.

## 2013-08-18 NOTE — Assessment & Plan Note (Signed)
With pain in the left groin, we will start conservative with hip rehabilitation exercises, Mobic, x-rays. Return in a month, femoroacetabular joint injection under ultrasound guidance if no better.

## 2013-08-18 NOTE — Assessment & Plan Note (Signed)
Left-sided with L4-L5 degenerative disease. We will start again conservatively with rehabilitation exercises and Mobic. If no better at the next visit we will get an MRI for interventional injection planning.

## 2013-09-27 ENCOUNTER — Ambulatory Visit: Payer: Federal, State, Local not specified - PPO | Admitting: Sports Medicine

## 2014-02-06 ENCOUNTER — Other Ambulatory Visit: Payer: Self-pay | Admitting: *Deleted

## 2014-02-06 DIAGNOSIS — Z0000 Encounter for general adult medical examination without abnormal findings: Secondary | ICD-10-CM

## 2014-02-07 ENCOUNTER — Ambulatory Visit
Admission: RE | Admit: 2014-02-07 | Discharge: 2014-02-07 | Disposition: A | Discharge status: Home / Self Care | Payer: Federal, State, Local not specified - PPO | Source: Ambulatory Visit | Attending: Internal Medicine | Admitting: Internal Medicine

## 2014-02-07 ENCOUNTER — Ambulatory Visit (INDEPENDENT_AMBULATORY_CARE_PROVIDER_SITE_OTHER): Payer: Federal, State, Local not specified - PPO | Admitting: Internal Medicine

## 2014-02-07 ENCOUNTER — Ambulatory Visit: Payer: Federal, State, Local not specified - PPO

## 2014-02-07 ENCOUNTER — Encounter: Payer: Self-pay | Admitting: Internal Medicine

## 2014-02-07 ENCOUNTER — Other Ambulatory Visit: Payer: Self-pay | Admitting: Internal Medicine

## 2014-02-07 VITALS — BP 115/70 | HR 63 | Resp 16 | Ht 66.0 in | Wt 129.0 lb

## 2014-02-07 DIAGNOSIS — Z1231 Encounter for screening mammogram for malignant neoplasm of breast: Secondary | ICD-10-CM

## 2014-02-07 DIAGNOSIS — Z1211 Encounter for screening for malignant neoplasm of colon: Secondary | ICD-10-CM

## 2014-02-07 DIAGNOSIS — Z0000 Encounter for general adult medical examination without abnormal findings: Secondary | ICD-10-CM | POA: Diagnosis not present

## 2014-02-07 DIAGNOSIS — L659 Nonscarring hair loss, unspecified: Secondary | ICD-10-CM | POA: Diagnosis not present

## 2014-02-07 DIAGNOSIS — Z01812 Encounter for preprocedural laboratory examination: Secondary | ICD-10-CM | POA: Diagnosis not present

## 2014-02-07 LAB — HEMOCCULT GUIAC POC 1CARD (OFFICE): FECAL OCCULT BLD: NEGATIVE

## 2014-02-07 LAB — COMPLETE METABOLIC PANEL WITH GFR
ALT: 14 U/L (ref 0–35)
AST: 21 U/L (ref 0–37)
Albumin: 4.3 g/dL (ref 3.5–5.2)
Alkaline Phosphatase: 67 U/L (ref 39–117)
BILIRUBIN TOTAL: 1.1 mg/dL (ref 0.2–1.2)
BUN: 12 mg/dL (ref 6–23)
CO2: 27 mEq/L (ref 19–32)
Calcium: 9.4 mg/dL (ref 8.4–10.5)
Chloride: 100 mEq/L (ref 96–112)
Creat: 0.76 mg/dL (ref 0.50–1.10)
GFR, Est Non African American: 89 mL/min
Glucose, Bld: 78 mg/dL (ref 70–99)
Potassium: 3.9 mEq/L (ref 3.5–5.3)
Sodium: 135 mEq/L (ref 135–145)
TOTAL PROTEIN: 6.8 g/dL (ref 6.0–8.3)

## 2014-02-07 LAB — POCT URINALYSIS DIPSTICK
Bilirubin, UA: NEGATIVE
Glucose, UA: NEGATIVE
KETONES UA: NEGATIVE
LEUKOCYTES UA: NEGATIVE
NITRITE UA: NEGATIVE
PH UA: 6.5
Protein, UA: NEGATIVE
RBC UA: NEGATIVE
Spec Grav, UA: 1.015
Urobilinogen, UA: NEGATIVE

## 2014-02-07 LAB — CBC WITH DIFFERENTIAL/PLATELET
BASOS ABS: 0 10*3/uL (ref 0.0–0.1)
Basophils Relative: 0 % (ref 0–1)
Eosinophils Absolute: 0 10*3/uL (ref 0.0–0.7)
Eosinophils Relative: 1 % (ref 0–5)
HEMATOCRIT: 41.4 % (ref 36.0–46.0)
Hemoglobin: 14.1 g/dL (ref 12.0–15.0)
LYMPHS ABS: 1.2 10*3/uL (ref 0.7–4.0)
Lymphocytes Relative: 25 % (ref 12–46)
MCH: 29.4 pg (ref 26.0–34.0)
MCHC: 34.1 g/dL (ref 30.0–36.0)
MCV: 86.3 fL (ref 78.0–100.0)
MONOS PCT: 7 % (ref 3–12)
MPV: 10.1 fL (ref 8.6–12.4)
Monocytes Absolute: 0.3 10*3/uL (ref 0.1–1.0)
NEUTROS PCT: 67 % (ref 43–77)
Neutro Abs: 3.1 10*3/uL (ref 1.7–7.7)
Platelets: 271 10*3/uL (ref 150–400)
RBC: 4.8 MIL/uL (ref 3.87–5.11)
RDW: 13.6 % (ref 11.5–15.5)
WBC: 4.6 10*3/uL (ref 4.0–10.5)

## 2014-02-07 LAB — LIPID PANEL
Cholesterol: 201 mg/dL — ABNORMAL HIGH (ref 0–200)
HDL: 75 mg/dL (ref >39)
LDL Cholesterol: 115 mg/dL — ABNORMAL HIGH (ref 0–99)
Total CHOL/HDL Ratio: 2.7 Ratio
Triglycerides: 56 mg/dL (ref <150)
VLDL: 11 mg/dL (ref 0–40)

## 2014-02-07 LAB — T3, FREE: T3, Free: 3.4 pg/mL (ref 2.3–4.2)

## 2014-02-07 LAB — T4, FREE: Free T4: 1.37 ng/dL (ref 0.80–1.80)

## 2014-02-07 LAB — TSH: TSH: 2.535 u[IU]/mL (ref 0.350–4.500)

## 2014-02-07 NOTE — Patient Instructions (Signed)
See me as needed 

## 2014-02-07 NOTE — Progress Notes (Signed)
Subjective:    Patient ID: Danielle Arnold, female    DOB: 1960/10/22, 54 y.o.   MRN: [id number]  HPI 8/15 sports med note Osteoarthritis of left hip - Silverio Decamp, MD at 08/18/2013 11:12 AM     Status: Written Related Problem: Osteoarthritis of left hip   Expand All Collapse All   With pain in the left groin, we will start conservative with hip rehabilitation exercises, Mobic, x-rays. Return in a month, femoroacetabular joint injection under ultrasound guidance if no better.            Left lumbar radiculitis - Silverio Decamp, MD at 08/18/2013 11:13 AM     Status: Written Related Problem: Left lumbar radiculitis   Expand All Collapse All   Left-sided with L4-L5 degenerative disease. We will start again conservatively with rehabilitation exercises and Mobic. If no better at the next visit we will get an MRI for interventional injection planning      12/2012 note Health Maintenance: Declines flu, Advised 3D mm at breast center,   Hyperlipidemia: Improved DASH diet given  Extreme breast density/Sister Breast CA Advised further imaging with U/S or MRI but most insurance will not pay. Will get 3D mm . She declines BRCA testing now  Lumbar radiculitis On prednisone taper     TODAY  Danielle Arnold is here for CPE  HM:  Declines all vaccines  Pap due 2017  She is having mm today .  Due for colonoscopy 2018  Smoked 4-5 years in her 34's   "Only on weekends"  Alopecia:  She did see a dermatologist and thinks her hair may be doing a little better   Problem list reviewed  Review of Systems  Respiratory: Negative for cough, chest tightness, shortness of breath and wheezing.   Cardiovascular: Negative for chest pain and leg swelling.  All other systems reviewed and are negative.      Objective:   Physical Exam Physical Exam  Nursing note and vitals reviewed.  Constitutional: She is oriented to person, place, and time. She appears well-developed  and well-nourished.  HENT:  Head: Normocephalic and atraumatic.  Right Ear: Tympanic membrane and ear canal normal. No drainage. Tympanic membrane is not injected and not erythematous.  Left Ear: Tympanic membrane and ear canal normal. No drainage. Tympanic membrane is not injected and not erythematous.  Nose: Nose normal. Right sinus exhibits no maxillary sinus tenderness and no frontal sinus tenderness. Left sinus exhibits no maxillary sinus tenderness and no frontal sinus tenderness.  Mouth/Throat: Oropharynx is clear and moist. No oral lesions. No oropharyngeal exudate.  Eyes: Conjunctivae and EOM are normal. Pupils are equal, round, and reactive to light.  Neck: Normal range of motion. Neck supple. No JVD present. Carotid bruit is not present. No mass and no thyromegaly present.  Cardiovascular: Normal rate, regular rhythm, S1 normal, S2 normal and intact distal pulses. Exam reveals no gallop and no friction rub.  No murmur heard.  Pulses:  Carotid pulses are 2+ on the right side, and 2+ on the left side.  Dorsalis pedis pulses are 2+ on the right side, and 2+ on the left side.  No carotid bruit. No LE edema  Pulmonary/Chest: Breath sounds normal. She has no wheezes. She has no rales. She exhibits no tenderness.  No discrete messes no nipple discharge no axilllary adenopathy bilaterally Abdominal: Soft. Bowel sounds are normal. She exhibits no distension and no mass. There is no hepatosplenomegaly. There is no tenderness. There is no CVA  tenderness.  Rectal no mass guaiac neg.  Musculoskeletal: Normal range of motion.  No active synovitis to joints.  Lymphadenopathy:  She has no cervical adenopathy.  She has no axillary adenopathy.  Right: No inguinal and no supraclavicular adenopathy present.  Left: No inguinal and no supraclavicular adenopathy present.  Neurological: She is alert and oriented to person, place, and time. She has normal strength and normal reflexes. She displays no  tremor. No cranial nerve deficit or sensory deficit. Coordination and gait normal.  Skin: Skin is warm and dry. No rash noted. No cyanosis. Nails show no clubbing.  Psychiatric: She has a normal mood and affect. Her speech is normal and behavior is normal. Cognition and memory are normal.           Assessment & Plan:  HM:  Declines all vaccines,  Due for colonoscopy 2018,  Mm and labs today   Educated lung CA screening guideline  -she does not meet criteria  Hyperlipidemia will check today   Alopecia  Will get free thyroid levels  Lumbar radiculitis   Followed by sports med

## 2014-02-08 ENCOUNTER — Telehealth: Payer: Self-pay | Admitting: *Deleted

## 2014-02-08 LAB — VITAMIN D 25 HYDROXY (VIT D DEFICIENCY, FRACTURES): Vit D, 25-Hydroxy: 45 ng/mL (ref 30–100)

## 2014-02-08 NOTE — Telephone Encounter (Signed)
I spoke with in regards to her Thyroid results

## 2014-02-08 NOTE — Telephone Encounter (Signed)
-----   Message from Lanice Shirts, MD sent at 02/08/2014 12:55 PM EST ----- Call pt and let her know that her free thyroid levels are normal ok to mail to her

## 2014-02-23 ENCOUNTER — Encounter: Payer: Self-pay | Admitting: *Deleted

## 2014-03-01 ENCOUNTER — Ambulatory Visit (INDEPENDENT_AMBULATORY_CARE_PROVIDER_SITE_OTHER): Payer: Federal, State, Local not specified - PPO | Admitting: Sports Medicine

## 2014-03-01 ENCOUNTER — Encounter: Payer: Self-pay | Admitting: Sports Medicine

## 2014-03-01 VITALS — BP 122/73 | HR 89 | Ht 66.0 in | Wt 132.0 lb

## 2014-03-01 DIAGNOSIS — M5416 Radiculopathy, lumbar region: Secondary | ICD-10-CM

## 2014-03-01 DIAGNOSIS — M722 Plantar fascial fibromatosis: Secondary | ICD-10-CM

## 2014-03-01 DIAGNOSIS — M1612 Unilateral primary osteoarthritis, left hip: Secondary | ICD-10-CM | POA: Diagnosis not present

## 2014-03-01 MED ORDER — GLUCOSAMINE-CHONDROITIN 500-400 MG PO TABS: 1.0000 | ORAL_TABLET | Freq: Three times a day (TID) | ORAL | Status: DC

## 2014-03-01 NOTE — Assessment & Plan Note (Signed)
Custom orthotics as above. 

## 2014-03-01 NOTE — Assessment & Plan Note (Signed)
Significantly improved. I'm going to add glucosamine chondroitin. Custom orthotics. Return as needed.

## 2014-03-01 NOTE — Progress Notes (Signed)

## 2014-03-01 NOTE — Assessment & Plan Note (Signed)
Doing well. Adding some home rehabilitation exercises, therapeutic/ergonomic chair.

## 2014-03-07 ENCOUNTER — Ambulatory Visit (INDEPENDENT_AMBULATORY_CARE_PROVIDER_SITE_OTHER): Payer: Federal, State, Local not specified - PPO | Admitting: Sports Medicine

## 2014-03-07 ENCOUNTER — Encounter: Payer: Self-pay | Admitting: Sports Medicine

## 2014-03-07 VITALS — BP 123/76 | HR 59 | Wt 132.0 lb

## 2014-03-07 DIAGNOSIS — M722 Plantar fascial fibromatosis: Secondary | ICD-10-CM | POA: Diagnosis not present

## 2014-03-07 DIAGNOSIS — M1612 Unilateral primary osteoarthritis, left hip: Secondary | ICD-10-CM | POA: Diagnosis not present

## 2014-03-07 DIAGNOSIS — M5416 Radiculopathy, lumbar region: Secondary | ICD-10-CM

## 2014-03-07 NOTE — Assessment & Plan Note (Signed)
Overall doing well with custom orthotics. She did develop some mild metatarsalgia, we planed out the EVA base under her metatarsal heads, we may need to use a metatarsal pad.

## 2014-03-07 NOTE — Assessment & Plan Note (Addendum)
Still with some symptoms. At this point we are going to order an MRI for interventional planning.

## 2014-03-07 NOTE — Assessment & Plan Note (Signed)
Overall doing well.

## 2014-03-07 NOTE — Progress Notes (Signed)
  Subjective:    CC: Follow-up  HPI: Plantar fasciitis: Overall doing well, she does have some complaints about the orthotics, particularly the left side, she feels as though her big toe and her small toe are off of the edge, also feels increasing pain at the metatarsal heads.  Lumbar radiculitis: Left-sided, L5 distribution, amenable to proceed with MRI for interventional, she has failed x-rays, steroids, NSAIDs, physical therapy.  Left hip osteoarthritis: Overall pain-free.  Past medical history, Surgical history, Family history not pertinant except as noted below, Social history, Allergies, and medications have been entered into the medical record, reviewed, and no changes needed.   Review of Systems: No fevers, chills, night sweats, weight loss, chest pain, or shortness of breath.   Objective:    General: Well Developed, well nourished, and in no acute distress.  Neuro: Alert and oriented x3, extra-ocular muscles intact, sensation grossly intact.  HEENT: Normocephalic, atraumatic, pupils equal round reactive to light, neck supple, no masses, no lymphadenopathy, thyroid nonpalpable.  Skin: Warm and dry, no rashes. Cardiac: Regular rate and rhythm, no murmurs rubs or gallops, no lower extremity edema.  Respiratory: Clear to auscultation bilaterally. Not using accessory muscles, speaking in full sentences.  Orthotics are in good shape, I was able to plan out under the metatarsal heads as well as laterally over where her first toe and fifth toe sit.  Impression and Recommendations:    I spent 40 minutes with this patient, greater than 50% was face to face time regarding the above diagnoses.

## 2014-03-08 ENCOUNTER — Telehealth: Payer: Self-pay

## 2014-03-08 NOTE — Telephone Encounter (Signed)
PA is not required patient has [employer]

## 2014-03-16 ENCOUNTER — Ambulatory Visit (INDEPENDENT_AMBULATORY_CARE_PROVIDER_SITE_OTHER): Payer: Federal, State, Local not specified - PPO

## 2014-03-16 DIAGNOSIS — M5416 Radiculopathy, lumbar region: Secondary | ICD-10-CM

## 2014-03-27 ENCOUNTER — Encounter: Payer: Self-pay | Admitting: Sports Medicine

## 2014-03-27 ENCOUNTER — Ambulatory Visit (INDEPENDENT_AMBULATORY_CARE_PROVIDER_SITE_OTHER): Payer: Federal, State, Local not specified - PPO | Admitting: Sports Medicine

## 2014-03-27 VITALS — BP 119/69 | HR 71 | Wt 131.0 lb

## 2014-03-27 DIAGNOSIS — M5416 Radiculopathy, lumbar region: Secondary | ICD-10-CM | POA: Diagnosis not present

## 2014-03-27 DIAGNOSIS — M1612 Unilateral primary osteoarthritis, left hip: Secondary | ICD-10-CM | POA: Diagnosis not present

## 2014-03-27 NOTE — Assessment & Plan Note (Signed)
MRI does show very mild L4-L5 degenerative change with potential for bilateral foraminal stenosis. The disc does not appear to come into contact with the nerve root however. Symptoms are mild, we did fill out FMLA paperwork today.

## 2014-03-27 NOTE — Progress Notes (Signed)
  Subjective:    CC: Follow-up  HPI: Danielle Arnold returns, she has left-sided lumbar radiculitis, clinically in an L5 distribution, MRI results will be dictated below. She is really not hurting that much.  Left hip osteoarthritis: Mild, persistent, again, not hurting enough to consider interventional treatment. She does want some FMLA paperwork filled out today.  Past medical history, Surgical history, Family history not pertinant except as noted below, Social history, Allergies, and medications have been entered into the medical record, reviewed, and no changes needed.   Review of Systems: No fevers, chills, night sweats, weight loss, chest pain, or shortness of breath.   Objective:    General: Well Developed, well nourished, and in no acute distress.  Neuro: Alert and oriented x3, extra-ocular muscles intact, sensation grossly intact.  HEENT: Normocephalic, atraumatic, pupils equal round reactive to light, neck supple, no masses, no lymphadenopathy, thyroid nonpalpable.  Skin: Warm and dry, no rashes. Cardiac: Regular rate and rhythm, no murmurs rubs or gallops, no lower extremity edema.  Respiratory: Clear to auscultation bilaterally. Not using accessory muscles, speaking in full sentences.  MRI shows an L4-L5 disc protrusion with potential for bilateral foraminal stenosis  Impression and Recommendations:    I spent 25 minutes with this patient, 50% was face-to-face time counseling regarding the below diagnoses and filling out paperwork.

## 2014-03-27 NOTE — Assessment & Plan Note (Signed)
Does continue to have some mild left-sided groin pain most likely related to her hip osteoarthritis. She has responded well to NSAIDs, and at this point pain is insufficient for her to consider me doing an injection of medicine into the hip joint. This is always an option, and she can return when she desires.

## 2014-04-11 ENCOUNTER — Encounter: Payer: Self-pay | Admitting: Internal Medicine

## 2014-04-11 ENCOUNTER — Ambulatory Visit (INDEPENDENT_AMBULATORY_CARE_PROVIDER_SITE_OTHER): Payer: Federal, State, Local not specified - PPO | Admitting: Internal Medicine

## 2014-04-11 VITALS — BP 113/71 | HR 59 | Temp 97.9°F | Resp 16 | Ht 66.0 in | Wt 131.0 lb

## 2014-04-11 DIAGNOSIS — R05 Cough: Secondary | ICD-10-CM | POA: Diagnosis not present

## 2014-04-11 DIAGNOSIS — R059 Cough, unspecified: Secondary | ICD-10-CM

## 2014-04-11 DIAGNOSIS — J029 Acute pharyngitis, unspecified: Secondary | ICD-10-CM

## 2014-04-11 DIAGNOSIS — R5383 Other fatigue: Secondary | ICD-10-CM

## 2014-04-11 DIAGNOSIS — IMO0001 Reserved for inherently not codable concepts without codable children: Secondary | ICD-10-CM

## 2014-04-11 LAB — POCT INFLUENZA A/B: Influenza A, POC: NEGATIVE

## 2014-04-11 MED ORDER — AZITHROMYCIN 250 MG PO TABS: ORAL_TABLET | ORAL | Status: DC

## 2014-04-11 NOTE — Progress Notes (Signed)
Subjective:    Patient ID: Danielle Arnold, female    DOB: 1960/08/31, 54 y.o.   MRN: [id number]  HPI  My note 1/26 2016 Assessment & Plan:  HM: Declines all vaccines, Due for colonoscopy 2018, Mm and labs today Educated lung CA screening guideline -she does not meet criteria  Hyperlipidemia will check today   Alopecia Will get free thyroid levels  Lumbar radiculitis Followed by sports med         TODAY  Danielle Arnold is here for acute visit   Exposure to co-workers with strep.  Pt has had several days of sore throat , nasal congestion and dry cough   Chilling at home but no documented fever.   Allergies  Allergen Reactions  . Penicillins Rash  . Percocet [Oxycodone-Acetaminophen] Itching   Past Medical History  Diagnosis Date  . Menopausal hot flushes   . Abnormal Pap smear and cervical HPV (human papillomavirus) 1989    Cone biopsy  ????dysplasia  . Anxiety     mild on no meds   Past Surgical History  Procedure Laterality Date  . Dilation and curettage of uterus  3/11  . Uterine fibroid embolization  2003  . Myomectomy  2000  . Cervical cone biopsy  89  . Ear operation  89 67 68    ear drum repair  . Appendectomy  80   History   Social History  . Marital Status: Single    Spouse Name: N/A  . Number of Children: N/A  . Years of Education: N/A   Occupational History  . Not on file.   Social History Main Topics  . Smoking status: Never Smoker   . Smokeless tobacco: Never Used  . Alcohol Use: Yes     Comment: glass of wine 3-4 times per week  . Drug Use: No  . Sexual Activity: No   Other Topics Concern  . Not on file   Social History Narrative   Family History  Problem Relation Age of Onset  . Cancer Sister     breast  premenopausal  . Cancer Maternal Grandmother     breast  . Sudden death Neg Hx   . Hypertension Neg Hx   . Heart attack Neg Hx   . Hyperlipidemia Neg Hx   . Diabetes Mother    Patient Active Problem List   Diagnosis Date Noted  . Osteoarthritis of left hip 08/18/2013  . Hyperlipidemia 12/31/2011  . Tinea 12/25/2011  . Constipation 12/25/2011  . Vaginal dryness 12/25/2011  . Left lumbar radiculitis 11/27/2011  . Sciatica 11/18/2011  . Plantar fasciitis 07/06/2011  . Heel spur 07/06/2011  . Left leg injury 02/10/2011  . Dermatitis 02/10/2011  . Anemia 11/21/2010  . Irritable bowel 11/21/2010  . FH: breast cancer in first degree relative 10/31/2010  . Anxiety   . Menopausal hot flushes    Current Outpatient Prescriptions on File Prior to Visit  Medication Sig Dispense Refill  . b complex vitamins tablet Take 1 tablet by mouth daily.    . cholecalciferol (VITAMIN D) 1000 UNITS tablet Take 400 Units by mouth daily.     . fish oil-omega-3 fatty acids 1000 MG capsule Take 2 g by mouth daily.    Marland Kitchen glucosamine-chondroitin 500-400 MG tablet Take 1 tablet by mouth 3 (three) times daily. 90 tablet 3  . L-GLUTAMINE PO Take by mouth.    . LYSINE PO Take by mouth as needed.     . meloxicam (MOBIC) 15 MG  tablet One tab PO qAM with breakfast for 2 weeks, then daily prn pain. 30 tablet 3  . Probiotic Product (SOLUBLE FIBER/PROBIOTICS PO) Take by mouth as needed.     . Turmeric POWD by Does not apply route.     No current facility-administered medications on file prior to visit.      Review of Systems    see HPI Objective:   Physical Exam  Physical Exam  Constitutional: She is oriented to person, place, and time. She appears well-developed and well-nourished. She is cooperative.  HENT:  Head: Normocephalic and atraumatic.  Right Ear: A middle ear effusion is present.  Left Ear: A middle ear effusion is present.  Nose: Mucosal edema present.  Mouth/Throat: Oropharyngeal exudate and posterior oropharyngeal erythema present.  Serous effusion bilaterally  Eyes: Conjunctivae and EOM are normal. Pupils are equal, round, and reactive to light.  Neck: Neck supple. Carotid bruit is not present.  No mass present.  Cardiovascular: Regular rhythm, normal heart sounds, intact distal pulses and normal pulses. Exam reveals no gallop and no friction rub.  No murmur heard.  Pulmonary/Chest: Breath sounds normal. She has no wheezes. She has no rhonchi. She has no rales.  Lymphadenopathy:  She has cervical adenopathy.  Neurological: She is alert and oriented to person, place, and time.  Skin: Skin is warm and dry. No abrasion, no bruising, no ecchymosis and no rash noted. No cyanosis. Nails show no clubbing.  Psychiatric: She has a normal mood and affect. Her speech is normal and behavior is normal.           Assessment & Plan:  Pharyngitis  Flu negative  Will give Z-pak   Cough  OK for OTC Delsym  Call if not better

## 2014-09-08 ENCOUNTER — Inpatient Hospital Stay (HOSPITAL_COMMUNITY)
Admission: AD | Admit: 2014-09-08 | Discharge: 2014-09-08 | Disposition: A | Discharge status: Home / Self Care | Payer: Federal, State, Local not specified - PPO | Source: Ambulatory Visit | Attending: Obstetrics and Gynecology | Admitting: Obstetrics and Gynecology

## 2014-09-08 ENCOUNTER — Inpatient Hospital Stay (HOSPITAL_COMMUNITY): Payer: Federal, State, Local not specified - PPO

## 2014-09-08 ENCOUNTER — Encounter (HOSPITAL_COMMUNITY): Payer: Self-pay | Admitting: *Deleted

## 2014-09-08 DIAGNOSIS — R1030 Lower abdominal pain, unspecified: Secondary | ICD-10-CM | POA: Diagnosis present

## 2014-09-08 DIAGNOSIS — R102 Pelvic and perineal pain: Secondary | ICD-10-CM

## 2014-09-08 HISTORY — DX: Endometriosis, unspecified: N80.9

## 2014-09-08 HISTORY — DX: Unspecified infectious disease: B99.9

## 2014-09-08 HISTORY — DX: Benign neoplasm of connective and other soft tissue, unspecified: D21.9

## 2014-09-08 LAB — URINE MICROSCOPIC-ADD ON

## 2014-09-08 LAB — CBC WITH DIFFERENTIAL/PLATELET
BASOS ABS: 0 10*3/uL (ref 0.0–0.1)
Basophils Relative: 0 % (ref 0–1)
Eosinophils Absolute: 0.1 10*3/uL (ref 0.0–0.7)
Eosinophils Relative: 1 % (ref 0–5)
HEMATOCRIT: 40.2 % (ref 36.0–46.0)
HEMOGLOBIN: 13.7 g/dL (ref 12.0–15.0)
Lymphocytes Relative: 18 % (ref 12–46)
Lymphs Abs: 1 10*3/uL (ref 0.7–4.0)
MCH: 29.3 pg (ref 26.0–34.0)
MCHC: 34.1 g/dL (ref 30.0–36.0)
MCV: 85.9 fL (ref 78.0–100.0)
Monocytes Absolute: 0.4 10*3/uL (ref 0.1–1.0)
Monocytes Relative: 7 % (ref 3–12)
NEUTROS ABS: 4.2 10*3/uL (ref 1.7–7.7)
NEUTROS PCT: 74 % (ref 43–77)
PLATELETS: 253 10*3/uL (ref 150–400)
RBC: 4.68 MIL/uL (ref 3.87–5.11)
RDW: 14 % (ref 11.5–15.5)
WBC: 5.7 10*3/uL (ref 4.0–10.5)

## 2014-09-08 LAB — URINALYSIS, ROUTINE W REFLEX MICROSCOPIC
Bilirubin Urine: NEGATIVE
GLUCOSE, UA: NEGATIVE mg/dL
Ketones, ur: NEGATIVE mg/dL
Nitrite: NEGATIVE
Protein, ur: NEGATIVE mg/dL
SPECIFIC GRAVITY, URINE: 1.01 (ref 1.005–1.030)
Urobilinogen, UA: 0.2 mg/dL (ref 0.0–1.0)
pH: 5.5 (ref 5.0–8.0)

## 2014-09-08 LAB — COMPREHENSIVE METABOLIC PANEL
ALK PHOS: 75 U/L (ref 38–126)
ALT: 17 U/L (ref 14–54)
AST: 22 U/L (ref 15–41)
Albumin: 4.4 g/dL (ref 3.5–5.0)
Anion gap: 10 (ref 5–15)
BILIRUBIN TOTAL: 0.7 mg/dL (ref 0.3–1.2)
BUN: 18 mg/dL (ref 6–20)
CALCIUM: 9.4 mg/dL (ref 8.9–10.3)
CHLORIDE: 101 mmol/L (ref 101–111)
CO2: 26 mmol/L (ref 22–32)
CREATININE: 0.76 mg/dL (ref 0.44–1.00)
Glucose, Bld: 95 mg/dL (ref 65–99)
Potassium: 3.7 mmol/L (ref 3.5–5.1)
Sodium: 137 mmol/L (ref 135–145)
Total Protein: 7.3 g/dL (ref 6.5–8.1)

## 2014-09-08 NOTE — MAU Provider Note (Signed)
History     CSN: [id number]  Arrival date and time: 09/08/14 1531   First Provider Initiated Contact with Patient 09/08/14 1628      Chief Complaint  Patient presents with  . Abdominal Pain   HPI  Ms. Danielle Arnold is a 54 y.o. [id number] who presents to MAU today with complaint of lower abdominal pain off and on for > 1 year but worse for the last 1-2 weeks. She states that during the last few weeks she has had episodes of pain daily. The patient has not seen her GYN in 4 years. She has not had a period since 2012. She states no pain now, but when the pain comes it is 10/10 in the lower abdomen and she will double over in pain. She denies vaginal bleeding, discharge, UTI symptoms, fever, N/V/D or constipation. She is not currently sexually active. She has a history of fibroids and endometriosis. She has had exploratory laparoscopy, myomectomy and uterine fibroid embolization in the past.   OB History    Gravida Para Term Preterm AB TAB SAB Ectopic Multiple Living   0 0 0 0 0 0 0 0 0 0       Past Medical History  Diagnosis Date  . Menopausal hot flushes   . Abnormal Pap smear and cervical HPV (human papillomavirus) 1989    Cone biopsy  ????dysplasia  . Anxiety     mild on no meds  . Infection     UTI  . Fibroid   . Endometriosis     Past Surgical History  Procedure Laterality Date  . Dilation and curettage of uterus  3/11  . Uterine fibroid embolization  2003  . Myomectomy  2000  . Cervical cone biopsy  89  . Ear operation  89 67 68    ear drum repair  . Appendectomy  80    Family History  Problem Relation Age of Onset  . Cancer Sister     breast  premenopausal  . Cancer Maternal Grandmother     breast  . Sudden death Neg Hx   . Hypertension Neg Hx   . Heart attack Neg Hx   . Hyperlipidemia Neg Hx     Social History  Substance Use Topics  . Smoking status: Never Smoker   . Smokeless tobacco: Never Used  . Alcohol Use: Yes     Comment: glass of wine 3-4  times per week    Allergies:  Allergies  Allergen Reactions  . Penicillins Rash  . Percocet [Oxycodone-Acetaminophen] Itching    Prescriptions prior to admission  Medication Sig Dispense Refill Last Dose  . b complex vitamins tablet Take 1 tablet by mouth daily.   09/07/2014 at Unknown time  . BIOTIN PO Take 1 tablet by mouth daily.   09/08/2014 at Unknown time  . cholecalciferol (VITAMIN D) 1000 UNITS tablet Take 400 Units by mouth daily.    09/07/2014 at Unknown time  . fish oil-omega-3 fatty acids 1000 MG capsule Take 2 g by mouth daily.   09/07/2014 at Unknown time  . FOLIC ACID PO Take 1 tablet by mouth daily.   09/08/2014 at Unknown time  . L-GLUTAMINE PO Take by mouth.   09/07/2014 at Unknown time  . Probiotic Product (SOLUBLE FIBER/PROBIOTICS PO) Take by mouth as needed.    09/07/2014 at Unknown time  . Turmeric POWD by Does not apply route.   09/07/2014 at Unknown time    Review of Systems  Constitutional: Negative for  fever and malaise/fatigue.  Gastrointestinal: Positive for abdominal pain. Negative for nausea, vomiting, diarrhea and constipation.  Genitourinary: Negative for dysuria, urgency and frequency.       Neg - vaginal bleeding, discharge   Physical Exam   Blood pressure 129/84, pulse 77, temperature 98.2 F (36.8 C), temperature source Oral, resp. rate 20, last menstrual period 05/14/2010, SpO2 99 %.  Physical Exam  Nursing note and vitals reviewed. Constitutional: She is oriented to person, place, and time. She appears well-developed and well-nourished. No distress.  HENT:  Head: Normocephalic and atraumatic.  Cardiovascular: Normal rate.   Respiratory: Effort normal.  GI: Soft. Bowel sounds are normal. She exhibits no distension and no mass. There is no tenderness. There is no rebound, no guarding and no CVA tenderness.  Neurological: She is alert and oriented to person, place, and time.  Skin: Skin is warm and dry. No erythema.  Psychiatric: She has a normal  mood and affect.   Results for orders placed or performed during the hospital encounter of 09/08/14 (from the past 24 hour(s))  Urinalysis, Routine w reflex microscopic (not at [facility])     Status: Abnormal   Collection Time: 09/08/14  3:50 PM  Result Value Ref Range   Color, Urine STRAW (A) YELLOW   APPearance CLEAR CLEAR   Specific Gravity, Urine 1.010 1.005 - 1.030   pH 5.5 5.0 - 8.0   Glucose, UA NEGATIVE NEGATIVE mg/dL   Hgb urine dipstick TRACE (A) NEGATIVE   Bilirubin Urine NEGATIVE NEGATIVE   Ketones, ur NEGATIVE NEGATIVE mg/dL   Protein, ur NEGATIVE NEGATIVE mg/dL   Urobilinogen, UA 0.2 0.0 - 1.0 mg/dL   Nitrite NEGATIVE NEGATIVE   Leukocytes, UA TRACE (A) NEGATIVE  Urine microscopic-add on     Status: None   Collection Time: 09/08/14  3:50 PM  Result Value Ref Range   Squamous Epithelial / LPF RARE RARE   WBC, UA 0-2 <3 WBC/hpf   RBC / HPF 3-6 <3 RBC/hpf   Bacteria, UA RARE RARE    MAU Course  Procedures None  MDM UA today CBC, CMP and [place] today  Patient declines pain medication as pain is intermittent Assessment and Plan  A: Lower abdominal pain, possible diverticulosis  P: Discharge home Patient advised to take Ibuprofen PRN for pain Warning signs for worsening condition discussed Patient advised to follow-up with Dr. Philis Pique as scheduled for routine GYN care Patient given contact information for [facility] in [place] and [place]. Patient will call to establish care.  Patient may return to MAU as needed or if her condition were to change or worsen   Luvenia Redden, PA-C  09/08/2014, 4:51 PM

## 2014-09-08 NOTE — MAU Note (Signed)
States has had intermittent abd. Pain over the last  Year, states pain is stabbing pain to right lower quad., states pain comes and goes with intermittent nausea.  Pain comes without warning and is becoming more persistent.

## 2014-09-08 NOTE — MAU Note (Addendum)
Sharp spasm like in lower abd, pt doubled over when walked into room with onset.  Has had it for years (maybe once every few months), past wk has been the worst, has been occuring daily.

## 2014-09-08 NOTE — Discharge Instructions (Signed)
Diverticulosis Diverticulosis is the condition that develops when small pouches (diverticula) form in the wall of your colon. Your colon, or large intestine, is where water is absorbed and stool is formed. The pouches form when the inside layer of your colon pushes through weak spots in the outer layers of your colon. CAUSES  No one knows exactly what causes diverticulosis. RISK FACTORS  Being older than 40. Your risk for this condition increases with age. Diverticulosis is rare in people younger than 40 years. By age 69, almost everyone has it.  Eating a low-fiber diet.  Being frequently constipated.  Being overweight.  Not getting enough exercise.  Smoking.  Taking over-the-counter pain medicines, like aspirin and ibuprofen. SYMPTOMS  Most people with diverticulosis do not have symptoms. DIAGNOSIS  Because diverticulosis often has no symptoms, health care providers often discover the condition during an exam for other colon problems. In many cases, a health care provider will diagnose diverticulosis while using a flexible scope to examine the colon (colonoscopy). TREATMENT  If you have never developed an infection related to diverticulosis, you may not need treatment. If you have had an infection before, treatment may include:  Eating more fruits, vegetables, and grains.  Taking a fiber supplement.  Taking a live bacteria supplement (probiotic).  Taking medicine to relax your colon. HOME CARE INSTRUCTIONS   Drink at least 6-8 glasses of water each day to prevent constipation.  Try not to strain when you have a bowel movement.  Keep all follow-up appointments. If you have had an infection before:  Increase the fiber in your diet as directed by your health care provider or dietitian.  Take a dietary fiber supplement if your health care provider approves.  Only take medicines as directed by your health care provider. SEEK MEDICAL CARE IF:   You have abdominal  pain.  You have bloating.  You have cramps.  You have not gone to the bathroom in 3 days. SEEK IMMEDIATE MEDICAL CARE IF:   Your pain gets worse.  Yourbloating becomes very bad.  You have a fever or chills, and your symptoms suddenly get worse.  You begin vomiting.  You have bowel movements that are bloody or black. MAKE SURE YOU:  Understand these instructions.  Will watch your condition.  Will get help right away if you are not doing well or get worse. Document Released: 09/27/2003 Document Revised: 01/04/2013 Document Reviewed: 11/24/2012 [facility] Patient Information 2015 [place], [place]. This information is not intended to replace advice given to you by your health care provider. Make sure you discuss any questions you have with your health care provider. Diverticulitis Diverticulitis is when small pockets that have formed in your colon (large intestine) become infected or swollen. HOME CARE  Follow your doctor's instructions.  Follow a special diet if told by your doctor.  When you feel better, your doctor may tell you to change your diet. You may be told to eat a lot of fiber. Fruits and vegetables are good sources of fiber. Fiber makes it easier to poop (have bowel movements).  Take supplements or probiotics as told by your doctor.  Only take medicines as told by your doctor.  Keep all follow-up visits with your doctor. GET HELP IF:  Your pain does not get better.  You have a hard time eating food.  You are not pooping like normal. GET HELP RIGHT AWAY IF:  Your pain gets worse.  Your problems do not get better.  Your problems suddenly get worse.  You have a fever.  You keep throwing up (vomiting).  You have bloody or black, tarry poop (stool). MAKE SURE YOU:   Understand these instructions.  Will watch your condition.  Will get help right away if you are not doing well or get worse. Document Released: 06/18/2007 Document Revised: 01/04/2013  Document Reviewed: 11/24/2012 [facility] Patient Information 2015 [place], [place]. This information is not intended to replace advice given to you by your health care provider. Make sure you discuss any questions you have with your health care provider.

## 2014-09-14 ENCOUNTER — Other Ambulatory Visit: Payer: Self-pay

## 2014-09-14 ENCOUNTER — Other Ambulatory Visit: Payer: Self-pay | Admitting: Family Medicine

## 2014-09-14 ENCOUNTER — Ambulatory Visit (HOSPITAL_BASED_OUTPATIENT_CLINIC_OR_DEPARTMENT_OTHER)
Admission: RE | Admit: 2014-09-14 | Discharge: 2014-09-14 | Disposition: A | Discharge status: Home / Self Care | Payer: Federal, State, Local not specified - PPO | Source: Ambulatory Visit | Attending: Family Medicine | Admitting: Family Medicine

## 2014-09-14 ENCOUNTER — Ambulatory Visit (INDEPENDENT_AMBULATORY_CARE_PROVIDER_SITE_OTHER): Payer: Federal, State, Local not specified - PPO | Admitting: Family Medicine

## 2014-09-14 ENCOUNTER — Encounter: Payer: Self-pay | Admitting: Family Medicine

## 2014-09-14 VITALS — BP 114/72 | HR 56 | Temp 98.8°F | Ht 66.0 in | Wt 132.0 lb

## 2014-09-14 DIAGNOSIS — R1032 Left lower quadrant pain: Secondary | ICD-10-CM | POA: Diagnosis not present

## 2014-09-14 DIAGNOSIS — R1011 Right upper quadrant pain: Secondary | ICD-10-CM

## 2014-09-14 DIAGNOSIS — B354 Tinea corporis: Secondary | ICD-10-CM | POA: Diagnosis not present

## 2014-09-14 DIAGNOSIS — K769 Liver disease, unspecified: Secondary | ICD-10-CM

## 2014-09-14 DIAGNOSIS — R319 Hematuria, unspecified: Secondary | ICD-10-CM | POA: Diagnosis not present

## 2014-09-14 LAB — POCT URINALYSIS DIPSTICK
Bilirubin, UA: NEGATIVE
Glucose, UA: NEGATIVE
KETONES UA: NEGATIVE
Leukocytes, UA: NEGATIVE
Nitrite, UA: NEGATIVE
PH UA: 6
Protein, UA: NEGATIVE
SPEC GRAV UA: 1.015
Urobilinogen, UA: 2

## 2014-09-14 MED ORDER — NYSTATIN-TRIAMCINOLONE 100000-0.1 UNIT/GM-% EX CREA: 1.0000 "application " | TOPICAL_CREAM | Freq: Two times a day (BID) | CUTANEOUS | Status: DC

## 2014-09-14 MED ORDER — IOHEXOL 300 MG/ML  SOLN
100.0000 mL | Freq: Once | INTRAMUSCULAR | Status: AC | PRN
  Administered 2014-09-14: 100 mL via INTRAVENOUS

## 2014-09-14 NOTE — Progress Notes (Signed)
Patient ID: Danielle Arnold, female    DOB: 03-Jan-1961  Age: 54 y.o. MRN: [id number]    Subjective:  Subjective HPI IMA HAFNER presents for LLQ pain x 3 weeks ---- she has had this in the past for years but this time it is worse and more frequent.  [facility] and pelvic [facility] done--- normal.  Pt also c/o a "weepy belly button"---- she had used triamcinolone/ nystatin in the past with good results.    Review of Systems  Constitutional: Negative for diaphoresis, appetite change, fatigue and unexpected weight change.  Eyes: Negative for pain, redness and visual disturbance.  Respiratory: Negative for cough, chest tightness, shortness of breath and wheezing.   Cardiovascular: Negative for chest pain, palpitations and leg swelling.  Gastrointestinal: Positive for abdominal pain.  Endocrine: Negative for cold intolerance, heat intolerance, polydipsia, polyphagia and polyuria.  Genitourinary: Negative for dysuria, frequency and difficulty urinating.  Neurological: Negative for dizziness, light-headedness, numbness and headaches.    History Past Medical History  Diagnosis Date  . Menopausal hot flushes   . Abnormal Pap smear and cervical HPV (human papillomavirus) 1989    Cone biopsy  ????dysplasia  . Anxiety     mild on no meds  . Infection     UTI  . Fibroid   . Endometriosis     She has past surgical history that includes Dilation and curettage of uterus (3/11); Uterine fibroid embolization (2003); Myomectomy (2000); Cervical cone biopsy (89); ear operation (89 67 68); and Appendectomy (80).   Her family history includes Cancer in her maternal grandmother and sister. There is no history of Sudden death, Hypertension, Heart attack, or Hyperlipidemia.She reports that she has never smoked. She has never used smokeless tobacco. She reports that she drinks alcohol. She reports that she does not use illicit drugs.  Current Outpatient Prescriptions on File Prior to Visit    Medication Sig Dispense Refill  . b complex vitamins tablet Take 1 tablet by mouth daily.    Marland Kitchen BIOTIN PO Take 1 tablet by mouth daily.    . cholecalciferol (VITAMIN D) 1000 UNITS tablet Take 400 Units by mouth daily.     . fish oil-omega-3 fatty acids 1000 MG capsule Take 2 g by mouth daily.    Marland Kitchen FOLIC ACID PO Take 1 tablet by mouth daily.    . L-GLUTAMINE PO Take by mouth.    . Probiotic Product (SOLUBLE FIBER/PROBIOTICS PO) Take by mouth as needed.      No current facility-administered medications on file prior to visit.     Objective:  Objective Physical Exam  Constitutional: She is oriented to person, place, and time. She appears well-developed and well-nourished.  HENT:  Head: Normocephalic and atraumatic.  Eyes: Conjunctivae and EOM are normal.  Neck: Normal range of motion. Neck supple. No JVD present. Carotid bruit is not present. No thyromegaly present.  Cardiovascular: Normal rate, regular rhythm and normal heart sounds.   No murmur heard. Pulmonary/Chest: Effort normal and breath sounds normal. No respiratory distress. She has no wheezes. She has no rales. She exhibits no tenderness.  Abdominal: Soft. Bowel sounds are normal. She exhibits no mass. There is tenderness in the right upper quadrant and left lower quadrant. There is no rebound and no guarding.    Musculoskeletal: She exhibits no edema.  Neurological: She is alert and oriented to person, place, and time.  Psychiatric: She has a normal mood and affect.   BP 114/72 mmHg  Pulse 56  Temp(Src) 98.8 F (37.1 C) (Oral)  Ht 5\' 6"  (1.676 m)  Wt 132 lb (59.875 kg)  BMI 21.32 kg/m2  SpO2 98%  LMP 05/14/2010 (Within Years) Wt Readings from Last 3 Encounters:  09/14/14 132 lb (59.875 kg)  04/11/14 131 lb (59.421 kg)  03/27/14 131 lb (59.421 kg)     Lab Results  Component Value Date   WBC 5.7 09/08/2014   HGB 13.7 09/08/2014   HCT 40.2 09/08/2014   PLT 253 09/08/2014   GLUCOSE 95 09/08/2014   CHOL 201*  02/07/2014   TRIG 56 02/07/2014   HDL 75 02/07/2014   LDLCALC 115* 02/07/2014   ALT 17 09/08/2014   AST 22 09/08/2014   NA 137 09/08/2014   K 3.7 09/08/2014   CL 101 09/08/2014   CREATININE 0.76 09/08/2014   BUN 18 09/08/2014   CO2 26 09/08/2014   TSH 2.535 02/07/2014    US Transvaginal Non-ob  09/08/2014   CLINICAL DATA:  Pelvic pain. Pain described as sharp pain in the right lower abdomen, intermittent for years but increased over the last week. History of a fibroid embolization and myomectomy.  EXAM: TRANSABDOMINAL AND TRANSVAGINAL ULTRASOUND OF PELVIS  TECHNIQUE: Both transabdominal and transvaginal ultrasound examinations of the pelvis were performed. Transabdominal technique was performed for global imaging of the pelvis including uterus, ovaries, adnexal regions, and pelvic cul-de-sac. It was necessary to proceed with endovaginal exam following the transabdominal exam to visualize the uterus, endometrium and ovaries to better advantage.  COMPARISON:  10/13/2012  FINDINGS: Uterus  Measurements: 5.6 x 3.3 x 4.0 cm. There is calcification noted, stable, consistent with a small calcified leiomyoma. No other uterine abnormalities.  Endometrium  Thickness: 2.8 mm.  No focal abnormality visualized.  Right ovary  Measurements: 1.6 x 1.7 x 1.8 cm. Normal appearance/no adnexal mass.  Left ovary  Measurements: 2.4 x 1.2 x 1.7 cm. Normal appearance/no adnexal mass.  Other findings  No free fluid.  IMPRESSION: 1. No acute findings. 2. Small presumed calcified uterine fibroid stable from the prior ultrasound. 3. No other abnormalities.  Normal ovaries and adnexa.   Electronically Signed   By: Lajean Manes M.D.   On: 09/08/2014 18:21   US Pelvis Complete  09/08/2014   CLINICAL DATA:  Pelvic pain. Pain described as sharp pain in the right lower abdomen, intermittent for years but increased over the last week. History of a fibroid embolization and myomectomy.  EXAM: TRANSABDOMINAL AND TRANSVAGINAL  ULTRASOUND OF PELVIS  TECHNIQUE: Both transabdominal and transvaginal ultrasound examinations of the pelvis were performed. Transabdominal technique was performed for global imaging of the pelvis including uterus, ovaries, adnexal regions, and pelvic cul-de-sac. It was necessary to proceed with endovaginal exam following the transabdominal exam to visualize the uterus, endometrium and ovaries to better advantage.  COMPARISON:  10/13/2012  FINDINGS: Uterus  Measurements: 5.6 x 3.3 x 4.0 cm. There is calcification noted, stable, consistent with a small calcified leiomyoma. No other uterine abnormalities.  Endometrium  Thickness: 2.8 mm.  No focal abnormality visualized.  Right ovary  Measurements: 1.6 x 1.7 x 1.8 cm. Normal appearance/no adnexal mass.  Left ovary  Measurements: 2.4 x 1.2 x 1.7 cm. Normal appearance/no adnexal mass.  Other findings  No free fluid.  IMPRESSION: 1. No acute findings. 2. Small presumed calcified uterine fibroid stable from the prior ultrasound. 3. No other abnormalities.  Normal ovaries and adnexa.   Electronically Signed   By: Lajean Manes M.D.   On:  09/08/2014 18:21     Assessment & Plan:  Plan I have discontinued Ms. Guyette's Turmeric. I am also having her start on nystatin-triamcinolone. Additionally, I am having her maintain her cholecalciferol, Probiotic Product (SOLUBLE FIBER/PROBIOTICS PO), fish oil-omega-3 fatty acids, L-GLUTAMINE PO, b complex vitamins, FOLIC ACID PO, and BIOTIN PO.  Meds ordered this encounter  Medications  . nystatin-triamcinolone (MYCOLOG II) cream    Sig: Apply 1 application topically 2 (two) times daily.    Dispense:  15 g    Refill:  1    Problem List Items Addressed This Visit    None    Visit Diagnoses    LLQ pain    -  Primary    Relevant Orders    CT Abdomen Pelvis W Contrast (Completed)    POCT Urinalysis Dipstick (Completed)    Urine Culture    Tinea corporis        Relevant Medications    nystatin-triamcinolone (MYCOLOG  II) cream    Other Relevant Orders    Wound culture    Hematuria        Relevant Orders    Urine Culture       Follow-up: Return in about 6 months (around 03/14/2015), or if symptoms worsen or fail to improve, for annual exam, fasting.  Garnet Koyanagi, DO

## 2014-09-14 NOTE — Patient Instructions (Signed)

## 2014-09-14 NOTE — Progress Notes (Signed)
Pre visit review using our clinic review tool, if applicable. No additional management support is needed unless otherwise documented below in the visit note. 

## 2014-09-15 LAB — URINE CULTURE
Colony Count: NO GROWTH
ORGANISM ID, BACTERIA: NO GROWTH

## 2014-09-17 LAB — WOUND CULTURE

## 2014-09-23 ENCOUNTER — Ambulatory Visit (HOSPITAL_BASED_OUTPATIENT_CLINIC_OR_DEPARTMENT_OTHER)
Admission: RE | Admit: 2014-09-23 | Discharge: 2014-09-23 | Disposition: A | Discharge status: Home / Self Care | Payer: Federal, State, Local not specified - PPO | Source: Ambulatory Visit | Attending: Family Medicine | Admitting: Family Medicine

## 2014-09-23 DIAGNOSIS — K760 Fatty (change of) liver, not elsewhere classified: Secondary | ICD-10-CM | POA: Insufficient documentation

## 2014-09-23 DIAGNOSIS — R1032 Left lower quadrant pain: Secondary | ICD-10-CM

## 2014-09-23 DIAGNOSIS — R1011 Right upper quadrant pain: Secondary | ICD-10-CM

## 2014-09-23 DIAGNOSIS — D1803 Hemangioma of intra-abdominal structures: Secondary | ICD-10-CM | POA: Diagnosis not present

## 2014-09-23 MED ORDER — GADOBENATE DIMEGLUMINE 529 MG/ML IV SOLN: 10.0000 mL | Freq: Once | INTRAVENOUS | Status: DC | PRN

## 2014-09-25 ENCOUNTER — Telehealth: Payer: Self-pay | Admitting: Internal Medicine

## 2014-09-25 DIAGNOSIS — K760 Fatty (change of) liver, not elsewhere classified: Secondary | ICD-10-CM

## 2014-09-25 DIAGNOSIS — R1032 Left lower quadrant pain: Secondary | ICD-10-CM

## 2014-09-25 NOTE — Telephone Encounter (Signed)
°  Relation to CQ:FJUV Call back number:[phone] Pharmacy:  Reason for call: pt saw dr. Etter Sjogren last Thursday states she is returning the call regarding her MRI results and also states she would like for dr. Etter Sjogren to do referral for the GI doctor to get a colonoscopy done.

## 2014-09-27 NOTE — Telephone Encounter (Signed)
Notes Recorded by Harl Bowie, CMA on 09/25/2014 at 10:50 AM Called and [facility] @ 10:48am @ [phone]) informing the pt of recent MRI results and note. Informed the pt to give [place] call back if she continue to have symptoms.//AB/CMA ------  Notes Recorded by Rosalita Chessman, DO on 09/25/2014 at 9:16 AM Fatty liver only----refer to GI if symptoms persist  Pt made aware of results by Kirtland Bouchard, CMA.  Please advise regarding referral to GI for colonoscopy.  Pt will establish care with Dr. Charlett Blake on 01/30/15.

## 2014-09-28 ENCOUNTER — Encounter: Payer: Self-pay | Admitting: Gastroenterology

## 2014-09-28 ENCOUNTER — Telehealth: Payer: Self-pay | Admitting: Internal Medicine

## 2014-09-28 NOTE — Telephone Encounter (Signed)
Caller name: Remedios Mckone Relationship to patient: Self  Can be reached: [phone] Pharmacy:  Reason for call: pt called in stating that her pain his progressing and she would like to get the referral to GI.   Once referral is in she would like to speak with the referral coordinator to determine location.

## 2014-09-28 NOTE — Telephone Encounter (Signed)
Ref has been placed.     KP 

## 2014-09-28 NOTE — Telephone Encounter (Signed)
Pt scheduled with [facility] for November

## 2014-09-28 NOTE — Telephone Encounter (Signed)
The ref has already been placed.     KP

## 2014-11-03 ENCOUNTER — Ambulatory Visit: Payer: Federal, State, Local not specified - PPO | Admitting: Family Medicine

## 2014-11-20 ENCOUNTER — Ambulatory Visit: Payer: Federal, State, Local not specified - PPO | Admitting: Gastroenterology

## 2014-12-28 ENCOUNTER — Ambulatory Visit (INDEPENDENT_AMBULATORY_CARE_PROVIDER_SITE_OTHER): Payer: Federal, State, Local not specified - PPO | Admitting: Sports Medicine

## 2014-12-28 ENCOUNTER — Encounter: Payer: Self-pay | Admitting: Sports Medicine

## 2014-12-28 VITALS — BP 132/82 | HR 67 | Temp 97.7°F | Resp 18 | Wt 133.1 lb

## 2014-12-28 DIAGNOSIS — M5416 Radiculopathy, lumbar region: Secondary | ICD-10-CM | POA: Diagnosis not present

## 2014-12-28 DIAGNOSIS — M222X1 Patellofemoral disorders, right knee: Secondary | ICD-10-CM

## 2014-12-28 NOTE — Assessment & Plan Note (Signed)
Recurrence of radiculopathy. She will take her meloxicam and we are going to proceed with formal physical therapy. She does have an MRI with L4-L5 degenerative changes.

## 2014-12-28 NOTE — Assessment & Plan Note (Signed)
Formal physical therapy, meloxicam, injection if no better.

## 2014-12-28 NOTE — Progress Notes (Signed)
Subjective:    I'm seeing this patient as a consultation for: Dr. Emi Belfast    CC: knee pain  HPI: Patient presents with a 1 week history of right knee pain that she noticed after hiking "around 40 miles" last week. She says that the pain is worst with extension of the knee and seems to come on when she is ambulating. She has not had any warmth or swelling in the area. She denies any falls or trauma to the area. Patient denies weakness, sensory changes, or radiation of the pain. She describes the pain as "mild to moderate" and while it has not stopped her from ambulating she is afraid it will stop her from hiking, which is her passion.  Patient also endorses continuing left sided lower back pain that shoots down her leg. She is unsure which toe it ends in and is unable to track the exact pathway of the shooting pain. She does says that this has been getting worse and notes that it is most notable when she has been sitting all day, which she does at her job in [place]. Of note, she has not been taking her Meloxicam consistently due to wanting to avoid pills. She still does not want to have any injections of anything "invasive." She says that the PT she had before for the same thing was helpful.  Past medical history, Surgical history, Family history not pertinant except as noted below, Social history, Allergies, and medications have been entered into the medical record, reviewed, and no changes needed.   Review of Systems: No headache, visual changes, nausea, vomiting, diarrhea, constipation, dizziness, abdominal pain, skin rash, fevers, chills, night sweats, weight loss, swollen lymph nodes, body aches, joint swelling, muscle aches, chest pain, shortness of breath, mood changes, visual or auditory hallucinations.   Objective:   General: Well Developed, well nourished, and in no acute distress.  Neuro/Psych: Alert and oriented x3, extra-ocular muscles intact, able to move all 4 extremities,  sensation grossly intact. Skin: Warm and dry, no rashes noted.  Respiratory: Not using accessory muscles, speaking in full sentences, trachea midline.  Cardiovascular: Pulses palpable, no extremity edema. Abdomen: Does not appear distended. Right Knee: Normal to inspection with no erythema or effusion or obvious bony abnormalities. Palpation notable for mild joint line tenderness on the lateral joint line and significant tenderness underneath the patella. No warmth or condyle tenderness. ROM full in flexion and extension and lower leg rotation. Ligaments with solid consistent endpoints including ACL, PCL, LCL, MCL. Negative Mcmurray's, Apley's, and Thessalonian tests. Non painful patellar compression. Patellar glide with crepitus. Patellar and quadriceps tendons unremarkable. Hamstring and quadriceps strength is normal.  Back Exam:  Inspection: Unremarkable  Motion: Flexion 45 deg, Extension 45 deg, Side Bending to 45 deg bilaterally,  Rotation to 45 deg bilaterally  SLR laying: Postive on the left, negative on right XSLR laying: Negative bilaterally Palpable tenderness: None. FABER: negative. Sensory change: Gross sensation intact to all lumbar and sacral dermatomes.  Reflexes: 2+ at both patellar tendons, 2+ at achilles tendons, Babinski's downgoing.  Strength at left foot  Plantar-flexion: 5/5 Dorsi-flexion: 5/5 Eversion: 5/5 Inversion: 5/5  Leg strength  Quad: 5/5 Hamstring: 5/5 Hip flexor: 5/5 Hip abductors: 5/5  Gait unremarkable.  Impression and Recommendations:   This case required medical decision making of moderate complexity.  Patient most likely has patellofemoral syndrome of the right knee given her history of hiking and worsening with exercise and her physical exam notable for tenderness underneath the  patella. Will continue current Meloxicam use and Rx PT.  Patient's worsening back pain in the setting of no change in quality is most likely worsening degenerative  disc disease (MRI + for L4/L5 degenerative changes). Patient is in agreement to use the Meloxicam regularly and to F/U with PT as this helped her in the past.  Patient will be given a work note for 2 weeks as her symptoms are worsened by sitting. Will F/U in 1 month.

## 2015-01-03 ENCOUNTER — Ambulatory Visit: Payer: Federal, State, Local not specified - PPO | Admitting: Physical Therapy

## 2015-01-30 ENCOUNTER — Ambulatory Visit: Payer: Federal, State, Local not specified - PPO | Admitting: Family Medicine

## 2015-05-09 DIAGNOSIS — D1803 Hemangioma of intra-abdominal structures: Secondary | ICD-10-CM | POA: Diagnosis not present

## 2015-05-09 DIAGNOSIS — W57XXXA Bitten or stung by nonvenomous insect and other nonvenomous arthropods, initial encounter: Secondary | ICD-10-CM | POA: Diagnosis not present

## 2015-05-09 DIAGNOSIS — L03116 Cellulitis of left lower limb: Secondary | ICD-10-CM | POA: Diagnosis not present

## 2015-05-09 DIAGNOSIS — E7800 Pure hypercholesterolemia, unspecified: Secondary | ICD-10-CM | POA: Diagnosis not present

## 2015-05-09 MED FILL — DOXYCYCLINE MONO 100 MG CAP: 100 | 10 days supply | Qty: 20 | Fill #0

## 2015-06-12 ENCOUNTER — Other Ambulatory Visit: Payer: Self-pay

## 2015-06-12 DIAGNOSIS — Z1231 Encounter for screening mammogram for malignant neoplasm of breast: Secondary | ICD-10-CM

## 2015-06-25 ENCOUNTER — Ambulatory Visit
Admission: RE | Admit: 2015-06-25 | Discharge: 2015-06-25 | Disposition: A | Discharge status: Home / Self Care | Payer: Federal, State, Local not specified - PPO | Source: Ambulatory Visit

## 2015-06-25 DIAGNOSIS — Z1231 Encounter for screening mammogram for malignant neoplasm of breast: Secondary | ICD-10-CM | POA: Diagnosis not present

## 2015-07-05 ENCOUNTER — Other Ambulatory Visit: Payer: Self-pay | Admitting: Internal Medicine

## 2015-07-05 ENCOUNTER — Other Ambulatory Visit (HOSPITAL_COMMUNITY)
Admission: RE | Admit: 2015-07-05 | Discharge: 2015-07-05 | Disposition: A | Discharge status: Home / Self Care | Payer: Federal, State, Local not specified - PPO | Source: Ambulatory Visit | Attending: Internal Medicine | Admitting: Internal Medicine

## 2015-07-05 DIAGNOSIS — R350 Frequency of micturition: Secondary | ICD-10-CM | POA: Diagnosis not present

## 2015-07-05 DIAGNOSIS — E559 Vitamin D deficiency, unspecified: Secondary | ICD-10-CM | POA: Diagnosis not present

## 2015-07-05 DIAGNOSIS — Z01419 Encounter for gynecological examination (general) (routine) without abnormal findings: Secondary | ICD-10-CM | POA: Diagnosis not present

## 2015-07-05 DIAGNOSIS — Z0000 Encounter for general adult medical examination without abnormal findings: Secondary | ICD-10-CM | POA: Diagnosis not present

## 2015-07-05 DIAGNOSIS — E7800 Pure hypercholesterolemia, unspecified: Secondary | ICD-10-CM | POA: Diagnosis not present

## 2015-07-05 MED FILL — ESTRACE 0.01% CREAM: 0.1 | 90 days supply | Qty: 43 | Fill #0

## 2015-07-09 LAB — CYTOLOGY - PAP

## 2015-09-25 DIAGNOSIS — N390 Urinary tract infection, site not specified: Secondary | ICD-10-CM | POA: Diagnosis not present

## 2015-09-25 DIAGNOSIS — K080 Exfoliation of teeth due to systemic causes: Secondary | ICD-10-CM | POA: Diagnosis not present

## 2015-09-25 DIAGNOSIS — R309 Painful micturition, unspecified: Secondary | ICD-10-CM | POA: Diagnosis not present

## 2015-09-25 MED FILL — CIPROFLOXACIN HCL 250 MG TA: 250 | 3 days supply | Qty: 6 | Fill #0

## 2015-10-05 DIAGNOSIS — R202 Paresthesia of skin: Secondary | ICD-10-CM | POA: Diagnosis not present

## 2015-10-08 DIAGNOSIS — M25522 Pain in left elbow: Secondary | ICD-10-CM | POA: Diagnosis not present

## 2015-10-08 DIAGNOSIS — M25521 Pain in right elbow: Secondary | ICD-10-CM | POA: Diagnosis not present

## 2015-10-08 DIAGNOSIS — W57XXXD Bitten or stung by nonvenomous insect and other nonvenomous arthropods, subsequent encounter: Secondary | ICD-10-CM | POA: Diagnosis not present

## 2015-10-31 DIAGNOSIS — D2271 Melanocytic nevi of right lower limb, including hip: Secondary | ICD-10-CM | POA: Diagnosis not present

## 2015-10-31 DIAGNOSIS — L720 Epidermal cyst: Secondary | ICD-10-CM | POA: Diagnosis not present

## 2015-10-31 DIAGNOSIS — D2272 Melanocytic nevi of left lower limb, including hip: Secondary | ICD-10-CM | POA: Diagnosis not present

## 2015-11-26 DIAGNOSIS — H2513 Age-related nuclear cataract, bilateral: Secondary | ICD-10-CM | POA: Diagnosis not present

## 2015-11-26 DIAGNOSIS — H524 Presbyopia: Secondary | ICD-10-CM | POA: Diagnosis not present

## 2016-01-16 DIAGNOSIS — R42 Dizziness and giddiness: Secondary | ICD-10-CM | POA: Diagnosis not present

## 2016-01-16 DIAGNOSIS — H6592 Unspecified nonsuppurative otitis media, left ear: Secondary | ICD-10-CM | POA: Diagnosis not present

## 2016-01-16 MED FILL — DOXYCYCLINE HYCLATE 100 MG: 100 | 10 days supply | Qty: 20 | Fill #0

## 2016-01-16 MED FILL — MECLIZINE 25 MG TABLET: 25 | 6 days supply | Qty: 20 | Fill #0

## 2016-01-21 DIAGNOSIS — R42 Dizziness and giddiness: Secondary | ICD-10-CM | POA: Diagnosis not present

## 2016-02-04 ENCOUNTER — Other Ambulatory Visit: Payer: Self-pay | Admitting: Internal Medicine

## 2016-02-04 DIAGNOSIS — G4452 New daily persistent headache (NDPH): Secondary | ICD-10-CM | POA: Diagnosis not present

## 2016-02-04 DIAGNOSIS — N809 Endometriosis, unspecified: Secondary | ICD-10-CM | POA: Diagnosis not present

## 2016-02-04 DIAGNOSIS — R42 Dizziness and giddiness: Secondary | ICD-10-CM | POA: Diagnosis not present

## 2016-02-07 ENCOUNTER — Ambulatory Visit
Admission: RE | Admit: 2016-02-07 | Discharge: 2016-02-07 | Disposition: A | Discharge status: Home / Self Care | Payer: Federal, State, Local not specified - PPO | Source: Ambulatory Visit | Attending: Internal Medicine | Admitting: Internal Medicine

## 2016-02-07 DIAGNOSIS — G4452 New daily persistent headache (NDPH): Secondary | ICD-10-CM

## 2016-02-07 DIAGNOSIS — R51 Headache: Secondary | ICD-10-CM | POA: Diagnosis not present

## 2016-02-07 DIAGNOSIS — R42 Dizziness and giddiness: Secondary | ICD-10-CM | POA: Diagnosis not present

## 2016-02-07 MED ORDER — IOPAMIDOL (ISOVUE-300) INJECTION 61%
75.0000 mL | Freq: Once | INTRAVENOUS | Status: AC | PRN
  Administered 2016-02-07: 75 mL via INTRAVENOUS

## 2016-02-15 MED FILL — NABUMETONE 500 MG TABLET: 500 | 15 days supply | Qty: 30 | Fill #0

## 2016-02-20 ENCOUNTER — Encounter: Payer: Self-pay | Admitting: Physical Therapy

## 2016-02-20 ENCOUNTER — Ambulatory Visit: Payer: Federal, State, Local not specified - PPO | Attending: Internal Medicine | Admitting: Physical Therapy

## 2016-02-20 DIAGNOSIS — R42 Dizziness and giddiness: Secondary | ICD-10-CM | POA: Diagnosis not present

## 2016-02-20 NOTE — Patient Instructions (Signed)
Gaze Stabilization - Tip Card  1.Target must remain in focus, not blurry, and appear stationary while head is in motion. 2.Perform exercises with small head movements (45 to either side of midline). 3.Increase speed of head motion so long as target is in focus. 4.If you wear eyeglasses, be sure you can see target through lens (therapist will give specific instructions for bifocal / progressive lenses). 5.These exercises may provoke dizziness or nausea. Work through these symptoms. If too dizzy, slow head movement slightly. Rest between each exercise. 6.Exercises demand concentration; avoid distractions. 7.For safety, perform standing exercises close to a counter, wall, corner, or next to someone.  Copyright  VHI. All rights reserved.   Gaze Stabilization - Standing Feet Apart   Feet shoulder width apart, keeping eyes on target on wall 3 feet away, tilt head down slightly and move head side to side for 30 seconds. Repeat while moving head up and down for 30 seconds. *Work up to tolerating 60 seconds, as able. Do 2-3 sessions per day.   Copyright  VHI. All rights reserved.    

## 2016-02-20 NOTE — Therapy (Signed)
[facility] [address] [place], [place], [address] Phone: [phone]   Fax:  [phone]  Physical Therapy Evaluation  Patient Details  Name: Danielle Arnold MRN: [id number] Date of Birth: December 03, 1960 Referring Provider: Karma Greaser, MD  Encounter Date: 02/20/2016      PT End of Session - 02/20/16 1139    Visit Number 1   Number of Visits 4   Date for PT Re-Evaluation 03/21/16   Authorization Type BCBS   PT Start Time 0933   PT Stop Time 1021   PT Time Calculation (min) 48 min   Activity Tolerance Patient tolerated treatment well   Behavior During Therapy [facility] for tasks assessed/performed      Past Medical History:  Diagnosis Date  . Abnormal Pap smear and cervical HPV (human papillomavirus) 1989   Cone biopsy  ????dysplasia  . Anxiety    mild on no meds  . Endometriosis   . Fibroid   . Infection    UTI  . Menopausal hot flushes     Past Surgical History:  Procedure Laterality Date  . APPENDECTOMY  80  . CERVICAL CONE BIOPSY  89  . DILATION AND CURETTAGE OF UTERUS  3/11  . ear operation  89 67 68   ear drum repair  . MYOMECTOMY  2000  . UTERINE FIBROID EMBOLIZATION  2003    There were no vitals filed for this visit.       Subjective Assessment - 02/20/16 0940    Subjective Pt presents to OPPT with reports of intermittent episodes of dizziness since Thanksgiving 2017.  In January 2018 dizziness became more severe, constant with headaches and nausea, no emesis.  Describes dizziness as unsteadiness that would come on one hour after waking up in the morning; reports going outside would help ease the symptoms.  Denies changes in vision or hearing.  CT was performed-unremarkable.  Pt reports dizziness is improving and she just feels a sense of "fogginess" now.  Pt denies h/o migraines or h/o vertigo.  Reports h/o surgery and scar tissue in L ear.     Limitations Other (comment)  hiking    Patient Stated Goals Get relief from the "foggy/dizzy" feeling   Currently in Pain? No/denies  but reports intermittent R sided neck pain, not focus of todays eval            [facility] PT Assessment - 02/20/16 0949      Assessment   Medical Diagnosis Dizziness   Referring Provider Karma Greaser, MD   Onset Date/Surgical Date 12/08/15   Hand Dominance Left   Next MD Visit Next Tuesday   Prior Therapy PT for back issues; 2011     Precautions   Precautions None     Balance Screen   Has the patient fallen in the past 6 months No   Has the patient had a decrease in activity level because of a fear of falling?  No   Is the patient reluctant to leave their home because of a fear of falling?  No     Prior Function   Level of Independence Independent   Leisure hiking     Observation/Other Assessments   Focus on Therapeutic Outcomes (FOTO)  Score 94 (6% limited; predicted 3% limited)   Dizziness Handicap Inventory (DHI)  8     Sensation   Light Touch Appears Intact     ROM / Strength   AROM / PROM / Strength Strength  Strength   Overall Strength Within functional limits for tasks performed            Vestibular Assessment - 02/20/16 0951      Vestibular Assessment   General Observation pt in no distress     Symptom Behavior   Type of Dizziness Imbalance   Frequency of Dizziness initially intermittent > constant; now intermittent "foggy" feeling   Duration of Dizziness 1-2 hours   Aggravating Factors Activity in general;Mornings   Relieving Factors Comments  going outside     Occulomotor Exam   Occulomotor Alignment Normal   Spontaneous Absent   Gaze-induced Absent   Head shaking Horizontal Absent   Smooth Pursuits Intact   Saccades Intact   Comment Convergence WNL     Vestibulo-Occular Reflex   VOR to Slow Head Movement Normal   VOR Cancellation Normal   Comment HIT: negative bilaterally     Visual Acuity   Static 8   Dynamic 5     Positional  Testing   Dix-Hallpike Dix-Hallpike Right;Dix-Hallpike Left   Horizontal Canal Testing Horizontal Canal Right;Horizontal Canal Left     Dix-Hallpike Right   Dix-Hallpike Right Duration 0   Dix-Hallpike Right Symptoms No nystagmus     Dix-Hallpike Left   Dix-Hallpike Left Duration 0   Dix-Hallpike Left Symptoms No nystagmus     Horizontal Canal Right   Horizontal Canal Right Duration 0   Horizontal Canal Right Symptoms Normal     Horizontal Canal Left   Horizontal Canal Left Duration 0   Horizontal Canal Left Symptoms Normal             PT Education - 02/20/16 1138    Education provided Yes   Education Details clinical findings, recommendations for HEP, goals for PT and POC   Person(s) Educated Patient   Methods Explanation;Demonstration;Handout   Comprehension Verbalized understanding             PT Long Term Goals - 02/20/16 1147      PT LONG TERM GOAL #1   Title (TARGET DATE FOR ALL LTG 03/20/16)  Will report full return to leisure and daily activities with symptoms of dizziness rated at <2/10   Status New     PT LONG TERM GOAL #2   Title Pt will demonstrate independence with gaze stability HEP   Status New     PT LONG TERM GOAL #3   Title Pt will complete SOT assessment (if indicated) with LTG TBD   Status New               Plan - 02/20/16 1140    Clinical Impression Statement Pt is a 56 year old female presenting to OPPT neuro for low complexity PT evaluation for 7-8 week history of dizziness and headaches. Pt's PMH significant for fatty liver and vitamin D insufficiencey. Patient presents with dizziness described as "foggy feeling" and neck pain; patient's signs and symptoms most consistent with impaired gaze stability and would benefit from skilled PT to address these impairments to maximize functional mobility independence.  Patient's neck pain appears to be related to guarding of head and neck movement and is not the primary focus of PT treatment  at this time; will continue to monitor and will address as needed.   Rehab Potential Excellent   Clinical Impairments Affecting Rehab Potential none   PT Frequency 1x / week   PT Duration 4 weeks   PT Treatment/Interventions Moist Heat;Traction;Electrical Stimulation;Canalith Repostioning;Therapeutic exercise;Balance training;Neuromuscular re-education;Patient/family education;Manual techniques;Passive  range of motion;Taping;Vestibular   PT Next Visit Plan re-assess symptoms, check x 1 viewing HEP, SOT assessment if needed   PT Home Exercise Plan x 1 viewing   Consulted and Agree with Plan of Care Patient      Patient will benefit from skilled therapeutic intervention in order to improve the following deficits and impairments:  Dizziness, Pain  Visit Diagnosis: Dizziness and giddiness     Problem List Patient Active Problem List   Diagnosis Date Noted  . Right patellofemoral syndrome 12/28/2014  . Osteoarthritis of left hip 08/18/2013  . Hyperlipidemia 12/31/2011  . Tinea 12/25/2011  . Constipation 12/25/2011  . Vaginal dryness 12/25/2011  . Left lumbar radiculitis 11/27/2011  . Sciatica 11/18/2011  . Plantar fasciitis 07/06/2011  . Heel spur 07/06/2011  . Left leg injury 02/10/2011  . Dermatitis 02/10/2011  . Anemia 11/21/2010  . Irritable bowel 11/21/2010  . FH: breast cancer in first degree relative 10/31/2010  . Anxiety   . Menopausal hot flushes     Raylene Everts, PT, DPT 02/20/16    11:55 AM   [facility] [address] [place], [place], [address] Phone: [phone]   Fax:  [phone]  Name: Danielle Arnold MRN: [id number] Date of Birth: 1961/01/12

## 2016-03-06 ENCOUNTER — Ambulatory Visit: Payer: Federal, State, Local not specified - PPO | Admitting: Rehabilitative and Restorative Service Providers"

## 2016-03-07 DIAGNOSIS — M5416 Radiculopathy, lumbar region: Secondary | ICD-10-CM | POA: Diagnosis not present

## 2016-03-07 DIAGNOSIS — R252 Cramp and spasm: Secondary | ICD-10-CM | POA: Diagnosis not present

## 2016-03-19 DIAGNOSIS — M5030 Other cervical disc degeneration, unspecified cervical region: Secondary | ICD-10-CM | POA: Diagnosis not present

## 2016-03-19 DIAGNOSIS — M5136 Other intervertebral disc degeneration, lumbar region: Secondary | ICD-10-CM | POA: Diagnosis not present

## 2016-03-19 MED FILL — predniSONE 5 MG TABS: 5 | 6 days supply | Qty: 21 | Fill #0

## 2016-03-26 DIAGNOSIS — K080 Exfoliation of teeth due to systemic causes: Secondary | ICD-10-CM | POA: Diagnosis not present

## 2016-04-03 DIAGNOSIS — R509 Fever, unspecified: Secondary | ICD-10-CM | POA: Diagnosis not present

## 2016-04-03 DIAGNOSIS — R05 Cough: Secondary | ICD-10-CM | POA: Diagnosis not present

## 2016-04-03 DIAGNOSIS — J209 Acute bronchitis, unspecified: Secondary | ICD-10-CM | POA: Diagnosis not present

## 2016-04-03 MED FILL — AZITHROMYCIN 250 MG TABLET: 250 | 5 days supply | Qty: 6 | Fill #0

## 2016-04-03 MED FILL — HYDROCODONE-CHLORPHENIRAM S: 10-8 | 10 days supply | Qty: 100 | Fill #0

## 2016-04-07 ENCOUNTER — Other Ambulatory Visit: Payer: Self-pay | Admitting: Internal Medicine

## 2016-04-07 DIAGNOSIS — R252 Cramp and spasm: Secondary | ICD-10-CM

## 2016-04-07 DIAGNOSIS — R202 Paresthesia of skin: Secondary | ICD-10-CM | POA: Diagnosis not present

## 2016-04-14 ENCOUNTER — Ambulatory Visit
Admission: RE | Admit: 2016-04-14 | Discharge: 2016-04-14 | Disposition: A | Discharge status: Home / Self Care | Payer: Federal, State, Local not specified - PPO | Source: Ambulatory Visit | Attending: Internal Medicine | Admitting: Internal Medicine

## 2016-04-14 DIAGNOSIS — R252 Cramp and spasm: Secondary | ICD-10-CM

## 2016-04-14 DIAGNOSIS — M79604 Pain in right leg: Secondary | ICD-10-CM | POA: Diagnosis not present

## 2016-04-14 DIAGNOSIS — M79605 Pain in left leg: Secondary | ICD-10-CM | POA: Diagnosis not present

## 2016-05-06 IMAGING — MR MR ABDOMEN WO/W CM
8 of 17 series · 20 of 48 positions shown · IV contrast (multihance)
Comparison: 09/14/2014

CLINICAL DATA: Right upper quadrant abdominal pain.

EXAM:
MRI ABDOMEN WITHOUT AND WITH CONTRAST
TECHNIQUE: Multiplanar multisequence MR imaging of the abdomen was performed
both before and after the administration of intravenous contrast.
CONTRAST:  10 cc of MultiHance.

[Series 4: [id number] free breathing · axial · 6.0mm · 1.71mm/px · z∈[-112,+105]mm · 4 of 90 slices shown]
[im 1/90]
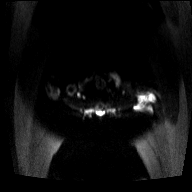
[im 30/90]
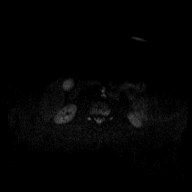
[im 60/90]
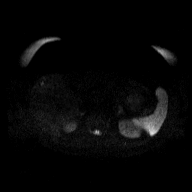
[im 90/90]
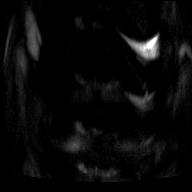

[Series 5: ep2d_[id number] free breathing_adc · axial · 6.0mm · 1.71mm/px · z∈[-112,+105]mm · 2 of 30 slices shown]
[im 1/30]
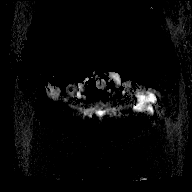
[im 30/30]
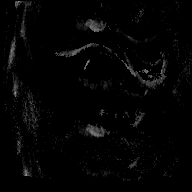

[Series 6: T2 fat-sat · axial · 6.0mm · 1.17mm/px · z∈[-132,+108]mm · 2 of 33 slices shown]
[im 1/33]
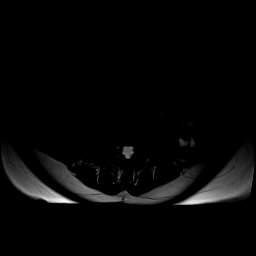
[im 33/33]
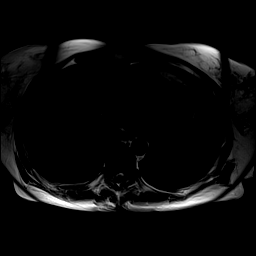

[Series 7: T2 · coronal · 7.0mm · 1.37mm/px · 2 of 29 slices shown (1 of 2)]
[im 1/29]
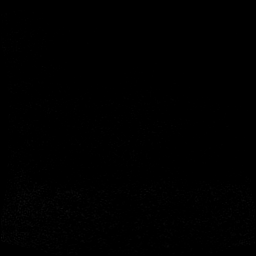
[im 29/29]
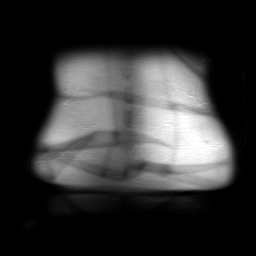

[Series 8: acr-in + out · axial · 6.0mm · 0.59mm/px · z∈[-147,+86]mm · 3 of 64 slices shown]
[im 1/64]
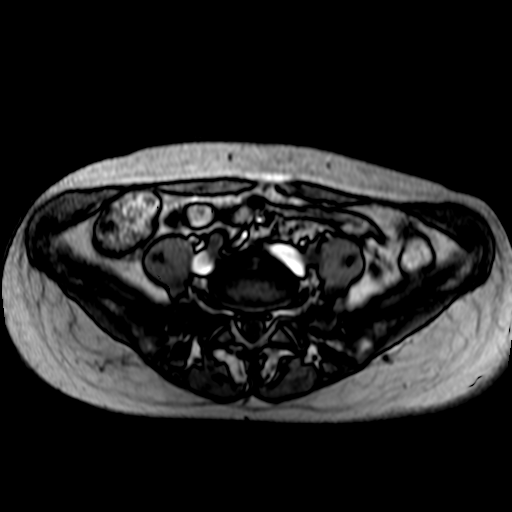
[im 32/64]
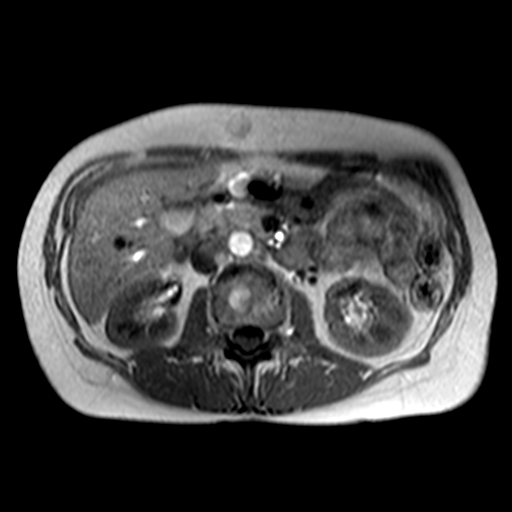
[im 64/64]
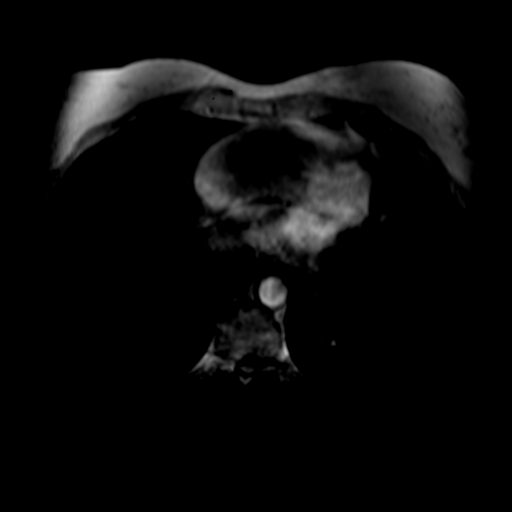

[Series 9: DWI · axial · 6.0mm · 0.59mm/px · z∈[-147,+86]mm · 2 of 32 slices shown]
[im 1/32]
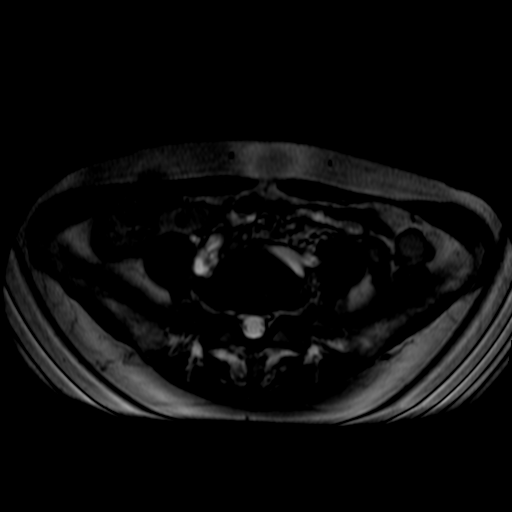
[im 32/32]
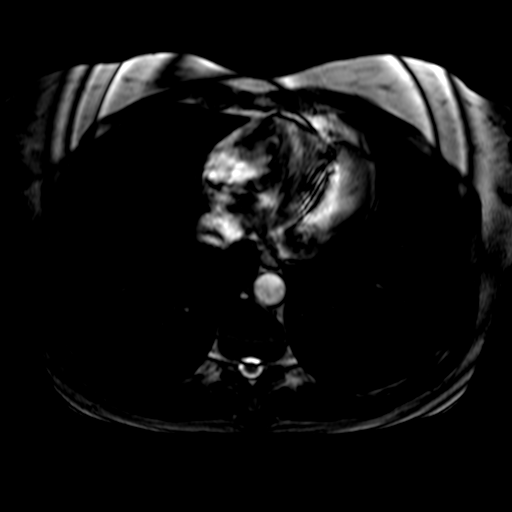

[Series 18: T2 · axial · 6.0mm · 1.48mm/px · z∈[-146,+86]mm · 2 of 32 slices shown (2 of 2)]
[im 1/32]
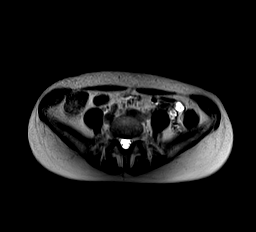
[im 32/32]
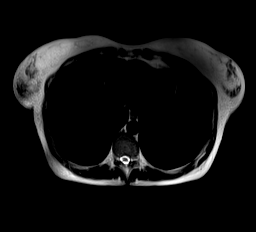

[Series 5004: 3 min delay · axial · delayed · 4.4mm · 0.59mm/px · z∈[-152,+90]mm · 3 of 56 slices shown]
[im 1/56]
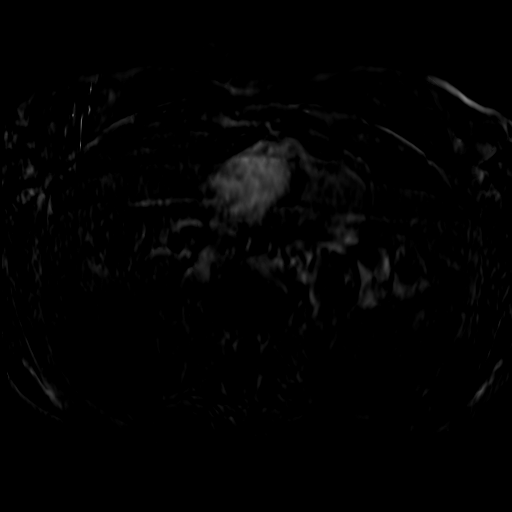
[im 28/56]
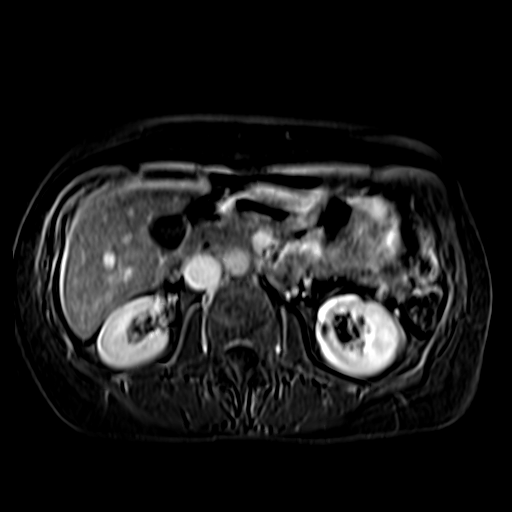
[im 56/56]
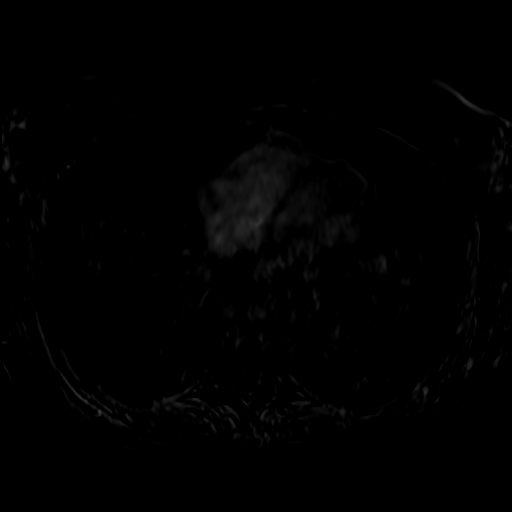

[20 of 48 positions shown; findings below may reference images not displayed]

FINDINGS: Lower chest:  No pleural or pericardial effusion.

Hepatobiliary: Hemangioma within the right lobe of liver is
identified measuring 3.2 cm, image 15 of series 11. Normal
appearance of the gallbladder. No biliary dilatation. Mild diffuse
hepatic steatosis

Pancreas: The pancreas appears normal.

Spleen: Normal appearance of the spleen.

Adrenals/Urinary Tract: The adrenal glands are unremarkable. Normal
appearance of both kidneys.

Stomach/Bowel: The stomach and the small bowel loops have a normal
course and caliber. The visualized portions of the colon are
unremarkable.

Vascular/Lymphatic: Normal appearance of the abdominal aorta. No
enlarged upper abdominal lymph nodes identified.

Other: There is no free fluid or fluid collections.

Musculoskeletal: Normal signal from within the bone marrow.
IMPRESSION: 1. No acute findings within the upper abdomen.
2. Hepatic steatosis
3. Liver hemangioma.

## 2016-05-26 ENCOUNTER — Ambulatory Visit (INDEPENDENT_AMBULATORY_CARE_PROVIDER_SITE_OTHER): Payer: Federal, State, Local not specified - PPO | Admitting: Sports Medicine

## 2016-05-26 ENCOUNTER — Encounter: Payer: Self-pay | Admitting: Sports Medicine

## 2016-05-26 DIAGNOSIS — M222X1 Patellofemoral disorders, right knee: Secondary | ICD-10-CM | POA: Diagnosis not present

## 2016-05-26 NOTE — Assessment & Plan Note (Signed)
New set of custom orthotics as above. 

## 2016-05-26 NOTE — Progress Notes (Addendum)
    Patient was fitted for a : standard, cushioned, semi-rigid orthotic. The orthotic was heated and afterward the patient stood on the orthotic blank positioned on the orthotic stand. The patient was positioned in subtalar neutral position and 10 degrees of ankle dorsiflexion in a weight bearing stance. After completion of molding, a stable base was applied to the orthotic blank. The blank was ground to a stable position for weight bearing. 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 The patient ambulated these, and they were very comfortable.  I spent 40 minutes with this patient, greater than 50% was face-to-face time counseling regarding the below diagnosis.

## 2016-07-02 ENCOUNTER — Encounter: Payer: Self-pay | Admitting: Physical Therapy

## 2016-07-02 NOTE — Therapy (Signed)
[facility] [address] [place], [place], [address] Phone: [phone]   Fax:  [phone]  Patient Details  Name: SUHEY RADFORD MRN: [id number] Date of Birth: 1960/07/03 Referring Provider:  No ref. provider found  Encounter Date: 07/02/2016  PHYSICAL THERAPY DISCHARGE SUMMARY  Visits from Start of Care: 1  Current functional level related to goals / functional outcomes: Unable to assess; pt did not return after initial evaluation   Remaining deficits: Dizziness   Education / Equipment: HEP  Plan: Patient agrees to discharge.  Patient goals were not met. Patient is being discharged due to not returning since the last visit.  ?????    Raylene Everts, PT, DPT 07/02/16    1:33 PM    [facility] [address] [place], [place], [address] Phone: [phone]   Fax:  [phone]

## 2016-07-10 ENCOUNTER — Encounter: Payer: Self-pay | Admitting: Physical Therapy

## 2016-07-24 DIAGNOSIS — R42 Dizziness and giddiness: Secondary | ICD-10-CM | POA: Diagnosis not present

## 2016-07-24 DIAGNOSIS — I499 Cardiac arrhythmia, unspecified: Secondary | ICD-10-CM | POA: Diagnosis not present

## 2016-07-31 DIAGNOSIS — R202 Paresthesia of skin: Secondary | ICD-10-CM | POA: Diagnosis not present

## 2016-07-31 DIAGNOSIS — E559 Vitamin D deficiency, unspecified: Secondary | ICD-10-CM | POA: Diagnosis not present

## 2016-07-31 DIAGNOSIS — E7800 Pure hypercholesterolemia, unspecified: Secondary | ICD-10-CM | POA: Diagnosis not present

## 2016-07-31 DIAGNOSIS — Z803 Family history of malignant neoplasm of breast: Secondary | ICD-10-CM | POA: Diagnosis not present

## 2016-07-31 DIAGNOSIS — Z0000 Encounter for general adult medical examination without abnormal findings: Secondary | ICD-10-CM | POA: Diagnosis not present

## 2016-07-31 DIAGNOSIS — R42 Dizziness and giddiness: Secondary | ICD-10-CM | POA: Diagnosis not present

## 2016-08-07 DIAGNOSIS — H40013 Open angle with borderline findings, low risk, bilateral: Secondary | ICD-10-CM | POA: Diagnosis not present

## 2016-09-18 ENCOUNTER — Ambulatory Visit: Payer: Federal, State, Local not specified - PPO | Admitting: Neurology

## 2016-10-06 DIAGNOSIS — K080 Exfoliation of teeth due to systemic causes: Secondary | ICD-10-CM | POA: Diagnosis not present

## 2016-10-09 ENCOUNTER — Ambulatory Visit (INDEPENDENT_AMBULATORY_CARE_PROVIDER_SITE_OTHER): Payer: Federal, State, Local not specified - PPO | Admitting: Sports Medicine

## 2016-10-09 DIAGNOSIS — M7742 Metatarsalgia, left foot: Secondary | ICD-10-CM | POA: Diagnosis not present

## 2016-10-09 DIAGNOSIS — M25512 Pain in left shoulder: Secondary | ICD-10-CM | POA: Diagnosis not present

## 2016-10-09 NOTE — Progress Notes (Signed)
   Subjective:    I'm seeing this patient as a consultation for:  Danielle Arnold   CC: left foot pain   HPI: Pt presents today with pain under the 3rd and 4th metatarsals for the last few weeks. She is an avid Museum/gallery curator. She reports occasional pain under the metatarsal heads, but denies numbness and tingling. She had a metatarsal pad for the 1st and 2nd metatarsals, but is not currently wearing it. She reports the pain is worst while walking on flat surfaces in tennis shoes. The pain is not precipitated by hiking or wearing her hiking boots.   She also reported left shoulder pain with activity. She reports that she was pulled up using her left arm and has experienced pain with activity since then.   Past medical history, Surgical history, Family history not pertinant except as noted below, Social history, Allergies, and medications have been entered into the medical record, reviewed, and no changes needed.   Review of Systems: No headache, visual changes, nausea, vomiting, diarrhea, constipation, dizziness, abdominal pain, skin rash, fevers, chills, night sweats, weight loss, swollen lymph nodes, body aches, joint swelling, muscle aches, chest pain, shortness of breath, mood changes, visual or auditory hallucinations.   Objective:   General: Well Developed, well nourished, and in no acute distress.  Neuro:  Extra-ocular muscles intact, able to move all 4 extremities, sensation grossly intact.  Deep tendon reflexes tested were normal. Psych: Alert and oriented, mood congruent with affect. ENT:  Ears and nose appear unremarkable.  Hearing grossly normal. Neck: Unremarkable overall appearance, trachea midline.  No visible thyroid enlargement. Eyes: Conjunctivae and lids appear unremarkable.  Pupils equal and round. Skin: Warm and dry, no rashes noted.  Cardiovascular: Pulses palpable, no extremity edema. Left Foot: No visible erythema or swelling. Range of motion is full in all  directions.. No hallux valgus. No pes cavus or pes planus.  Loss of left transverse arch.  Small abnormal callus noted under the 2nd metatarsal head. No tenderness to palpation of the calcaneal insertion of plantar fascia. No pain at the Achilles insertion. Tenderness to palpation under the left 3rd metatarsal head. No pain with compression of the metatarsal heads.    Impression and Recommendations:   This case required medical decision making of moderate complexity.   I suspect metatarsalgia of the 3rd metatarsal head. A metatarsal pad was placed under the left third metatarsal today. She was given a metatarsal pad to place in her hiking boots as well. A handout on metatarsalgia was given today. I suspect rotator cuff tendonitis of the left shoulder. She was given a handout of rotator cuff exercises today. She will follow up in 1 month.   No problem-specific Assessment & Plan notes found for this encounter.   ___________________________________________ Gwen Her. Dianah Field, M.D., ABFM., CAQSM. Primary Care and [place] [employer] [facility]

## 2016-10-09 NOTE — Assessment & Plan Note (Signed)
Impingement syndrome, rehabilitation exercises given, return in one month, injection if no better.

## 2016-10-09 NOTE — Assessment & Plan Note (Signed)
Small metatarsal pad placed an orthotic. Return in one month.

## 2016-10-30 DIAGNOSIS — L570 Actinic keratosis: Secondary | ICD-10-CM | POA: Diagnosis not present

## 2016-11-06 ENCOUNTER — Ambulatory Visit: Payer: Federal, State, Local not specified - PPO | Admitting: Sports Medicine

## 2016-11-27 DIAGNOSIS — K080 Exfoliation of teeth due to systemic causes: Secondary | ICD-10-CM | POA: Diagnosis not present

## 2017-01-12 ENCOUNTER — Other Ambulatory Visit: Payer: Self-pay

## 2017-01-12 ENCOUNTER — Emergency Department (HOSPITAL_BASED_OUTPATIENT_CLINIC_OR_DEPARTMENT_OTHER): Payer: Federal, State, Local not specified - PPO

## 2017-01-12 ENCOUNTER — Encounter (HOSPITAL_BASED_OUTPATIENT_CLINIC_OR_DEPARTMENT_OTHER): Payer: Self-pay | Admitting: *Deleted

## 2017-01-12 ENCOUNTER — Emergency Department (HOSPITAL_BASED_OUTPATIENT_CLINIC_OR_DEPARTMENT_OTHER)
Admission: EM | Admit: 2017-01-12 | Discharge: 2017-01-12 | Disposition: A | Discharge status: Home / Self Care | Payer: Federal, State, Local not specified - PPO | Attending: Emergency Medicine | Admitting: Emergency Medicine

## 2017-01-12 DIAGNOSIS — E871 Hypo-osmolality and hyponatremia: Secondary | ICD-10-CM

## 2017-01-12 DIAGNOSIS — H538 Other visual disturbances: Secondary | ICD-10-CM | POA: Diagnosis not present

## 2017-01-12 DIAGNOSIS — G5622 Lesion of ulnar nerve, left upper limb: Secondary | ICD-10-CM | POA: Diagnosis not present

## 2017-01-12 DIAGNOSIS — Z79899 Other long term (current) drug therapy: Secondary | ICD-10-CM | POA: Diagnosis not present

## 2017-01-12 DIAGNOSIS — H539 Unspecified visual disturbance: Secondary | ICD-10-CM

## 2017-01-12 DIAGNOSIS — R202 Paresthesia of skin: Secondary | ICD-10-CM | POA: Diagnosis not present

## 2017-01-12 LAB — CBC WITH DIFFERENTIAL/PLATELET
Basophils Absolute: 0 10*3/uL (ref 0.0–0.1)
Basophils Relative: 0 %
Eosinophils Absolute: 0 10*3/uL (ref 0.0–0.7)
Eosinophils Relative: 1 %
HCT: 36.8 % (ref 36.0–46.0)
Hemoglobin: 12.8 g/dL (ref 12.0–15.0)
Lymphocytes Relative: 21 %
Lymphs Abs: 1.3 10*3/uL (ref 0.7–4.0)
MCH: 29.6 pg (ref 26.0–34.0)
MCHC: 34.8 g/dL (ref 30.0–36.0)
MCV: 85 fL (ref 78.0–100.0)
Monocytes Absolute: 0.5 10*3/uL (ref 0.1–1.0)
Monocytes Relative: 8 %
Neutro Abs: 4.4 10*3/uL (ref 1.7–7.7)
Neutrophils Relative %: 70 %
Platelets: 282 10*3/uL (ref 150–400)
RBC: 4.33 MIL/uL (ref 3.87–5.11)
RDW: 13 % (ref 11.5–15.5)
WBC: 6.1 10*3/uL (ref 4.0–10.5)

## 2017-01-12 LAB — BASIC METABOLIC PANEL
Anion gap: 7 (ref 5–15)
BUN: 13 mg/dL (ref 6–20)
CO2: 25 mmol/L (ref 22–32)
Calcium: 9 mg/dL (ref 8.9–10.3)
Chloride: 95 mmol/L — ABNORMAL LOW (ref 101–111)
Creatinine, Ser: 0.64 mg/dL (ref 0.44–1.00)
GFR calc Af Amer: >60 mL/min (ref >60)
GFR calc non Af Amer: >60 mL/min (ref >60)
Glucose, Bld: 102 mg/dL — ABNORMAL HIGH (ref 65–99)
Potassium: 3.7 mmol/L (ref 3.5–5.1)
Sodium: 127 mmol/L — ABNORMAL LOW (ref 135–145)

## 2017-01-12 NOTE — ED Provider Notes (Addendum)
[facility] EMERGENCY DEPARTMENT Provider Note   CSN: [id number] Arrival date & time: 01/12/17  1341     History   Chief Complaint Chief Complaint  Patient presents with  . Numbness    HPI Danielle Arnold is a 56 y.o. female.  HPI   56 year old female presenting after she saw a streak of light in her right eye.  Symptom onset was 1115 today.  She noticed a relatively vertical streak of bright light across her field of vision in her right eye.  She felt like she could see around it.  She felt like this was likely moving.  No pain.  Resolved at approximately 3 minutes with no recurrence since then.  Laid down for nap shortly after she experienced this.  When she woke up she noticed some tingling in her left hand which resolved after approximately 30 minutes to 45 minutes.  This tingling sensation was in her hand only.  But she is not quite sure if it was her entire hand/all fingers or more isolated.  No loss of strength. She reports that she has been having intermittent dull elbow pain for the past approximately 2 months.  Past Medical History:  Diagnosis Date  . Abnormal Pap smear and cervical HPV (human papillomavirus) 1989   Cone biopsy  ????dysplasia  . Anxiety    mild on no meds  . Endometriosis   . Fibroid   . Infection    UTI  . Menopausal hot flushes     Patient Active Problem List   Diagnosis Date Noted  . Metatarsalgia, left foot 10/09/2016  . Left shoulder pain 10/09/2016  . Right patellofemoral syndrome 12/28/2014  . Osteoarthritis of left hip 08/18/2013  . Hyperlipidemia 12/31/2011  . Tinea 12/25/2011  . Constipation 12/25/2011  . Vaginal dryness 12/25/2011  . Left lumbar radiculitis 11/27/2011  . Sciatica 11/18/2011  . Plantar fasciitis 07/06/2011  . Heel spur 07/06/2011  . Left leg injury 02/10/2011  . Dermatitis 02/10/2011  . Anemia 11/21/2010  . Irritable bowel 11/21/2010  . FH: breast cancer in first degree relative 10/31/2010  .  Anxiety   . Menopausal hot flushes     Past Surgical History:  Procedure Laterality Date  . APPENDECTOMY  80  . CERVICAL CONE BIOPSY  89  . DILATION AND CURETTAGE OF UTERUS  3/11  . ear operation  89 67 68   ear drum repair  . MYOMECTOMY  2000  . UTERINE FIBROID EMBOLIZATION  2003    OB History    Gravida Para Term Preterm AB Living   0 0 0 0 0 0   SAB TAB Ectopic Multiple Live Births   0 0 0 0         Home Medications    Prior to Admission medications   Medication Sig Start Date End Date Taking? Authorizing Provider  cholecalciferol (VITAMIN D) 1000 UNITS tablet Take 400 Units by mouth daily.    Yes [provider]  Multiple Vitamin (MULTIVITAMIN) tablet Take 1 tablet by mouth daily.   Yes [provider]  fish oil-omega-3 fatty acids 1000 MG capsule Take 2 g by mouth daily.    [provider]    Family History Family History  Problem Relation Age of Onset  . Cancer Sister        breast  premenopausal  . Cancer Maternal Grandmother        breast  . Sudden death Neg Hx   . Hypertension Neg Hx   .  Heart attack Neg Hx   . Hyperlipidemia Neg Hx     Social History Social History   Tobacco Use  . Smoking status: Never Smoker  . Smokeless tobacco: Never Used  Substance Use Topics  . Alcohol use: Yes    Comment: glass of wine 3-4 times per week  . Drug use: No     Allergies   Penicillins and Percocet [oxycodone-acetaminophen]   Review of Systems Review of Systems  All systems reviewed and negative, other than as noted in HPI.  Physical Exam Updated Vital Signs BP 137/73   Pulse 65   Temp (!) 97.5 F (36.4 C) (Oral)   Resp 16   Ht 5\' 6"  (1.676 m)   Wt 58.1 kg (128 lb)   LMP 05/14/2010 (Within Years)   SpO2 99%   BMI 20.66 kg/m   Physical Exam  Constitutional: She is oriented to person, place, and time. She appears well-developed and well-nourished. No distress.  HENT:  Head: Normocephalic and atraumatic.  Eyes:  Conjunctivae and EOM are normal. Pupils are equal, round, and reactive to light. Right eye exhibits no discharge. Left eye exhibits no discharge.  Bedside US without evidence of retinal detachment, large floaters, vitreous hemorrhage R eye.   Neck: Neck supple.  Cardiovascular: Normal rate, regular rhythm and normal heart sounds. Exam reveals no gallop and no friction rub.  No murmur heard. Pulmonary/Chest: Effort normal and breath sounds normal. No respiratory distress.  Abdominal: Soft. She exhibits no distension. There is no tenderness.  Musculoskeletal: She exhibits no edema or tenderness.  Neurological: She is alert and oriented to person, place, and time. She displays normal reflexes. No cranial nerve deficit or sensory deficit. She exhibits normal muscle tone. Coordination normal.  Skin: Skin is warm and dry.  Psychiatric: She has a normal mood and affect. Her behavior is normal. Thought content normal.  Nursing note and vitals reviewed.    ED Treatments / Results  Labs (all labs ordered are listed, but only abnormal results are displayed) Labs Reviewed  BASIC METABOLIC PANEL - Abnormal; Notable for the following components:      Result Value   Sodium 127 (*)    Chloride 95 (*)    Glucose, Bld 102 (*)    All other components within normal limits  CBC WITH DIFFERENTIAL/PLATELET    EKG  EKG Interpretation  Date/Time:  Monday January 12 2017 14:06:57 EST Ventricular Rate:  63 PR Interval:    QRS Duration: 102 QT Interval:  427 QTC Calculation: 438 R Axis:   -34 Text Interpretation:  Sinus rhythm Left axis deviation Low voltage, precordial leads No old tracing to compare Confirmed by Virgel Manifold [phone]) on 01/12/2017 3:28:50 PM       Radiology Ct Head Wo Contrast  Result Date: 01/12/2017 CLINICAL DATA:  New onset of flashes in the right eye. Numbness or tingling, paresthesias in the left hand. EXAM: CT HEAD WITHOUT CONTRAST TECHNIQUE: Contiguous axial images were  obtained from the base of the skull through the vertex without intravenous contrast. COMPARISON:  None. FINDINGS: Brain: No acute infarct, hemorrhage, or mass lesion is present. The ventricles are of normal size. No significant extraaxial fluid collection is present. Vascular: No hyperdense vessel or unexpected calcification. Skull: Calvarium is intact. No focal lytic or blastic lesions are present. Sinuses/Orbits: The paranasal sinuses and mastoid air cells are clear. Globes and orbits are within normal limits. IMPRESSION: Negative CT of the head. Electronically Signed   By: Wynetta Fines.D.  On: 01/12/2017 15:52    Procedures Procedures (including critical care time) EMERGENCY DEPARTMENT [place] OCULAR EXAM "Study: Limited Ultrasound of Orbit "  INDICATIONS: Floaters/Flashes Linear probe utilized to obtain images in both long and short axis of the orbit having the patient look left and right if possible.  PERFORMED BY: Myself IMAGES ARCHIVED?: No LIMITATIONS: none VIEWS USED: Right orbit INTERPRETATION: No retinal detachment   Medications Ordered in ED Medications - No data to display   Initial Impression / Assessment and Plan / ED Course  I have reviewed the triage vital signs and the nursing notes.  Pertinent labs & imaging results that were available during my care of the patient were reviewed by me and considered in my medical decision making (see chart for details).     56 year old female with flash of light in her right eye which has since resolved.  No change in visual acuity.  Her neuro exam is nonfocal.  No visual field cuts on exam. Bedside ultrasound without evidence of retinal detachment.  She is also complaining of numbness in her left hand which has also resolved.  I suspect this is a peripheral nerve issue.  The way she describes it, I suspect this is ulnar neuropathy/cubital tunnel syndrome.  She has been complaining of intermittent left elbow pain for the past  two months which would support this. Symptoms noticed when she woke up form a nap. Her symptoms are only in her hand which is most consistent with a peripheral etiology as well.  She is concerned about a possible stroke.  Her symptoms have completely resolved.  TIA is a consideration, but I doubt.   Interestingly, she is hyponatremic. Chronicity unclear but wasn't on labs over two years ago. Clinically euvolemic. Renal function is ok. Denies excessive free water. No obvious offending meds. Denies confusion but has had intermittent dizziness for almost a year when speaking with her further. I'm not sure if related or not. Not quite to point that I feel she needs hospital admission but advised to have repeat blood work in a week and follow-up with PCP.   Final Clinical Impressions(s) / ED Diagnoses   Final diagnoses:  Visual disturbance of one eye  Ulnar neuropathy at elbow of left upper extremity  Hyponatremia    ED Discharge Orders    None       Virgel Manifold, MD 01/12/17 1604    Virgel Manifold, MD 01/26/17 [id number]

## 2017-01-12 NOTE — ED Triage Notes (Signed)
11:15 she had a 3 minute streak of bright light from her right eye. Tingling in her left hand since.

## 2017-01-12 NOTE — ED Notes (Signed)
MD made aware of patient at this time. No orders received.

## 2017-01-12 NOTE — Discharge Instructions (Signed)
Your sodium level today is low. I do not think this explains your vision changes or numbness in your L hand though. The dizziness you have been experiencing over the past year or so may potentially be related to this. This was not noted on previous labs but the last I have for comparison purposes was over 2 years ago. You are not any any medications which can do this. Other common causes are drinking excessive amounts of fluid. Certain endocrine problems cause cause this too.  It's not to a level that you require hospitalization. I want you to have repeat blood work in a week and follow-up with your PCP.

## 2017-01-19 DIAGNOSIS — E871 Hypo-osmolality and hyponatremia: Secondary | ICD-10-CM | POA: Diagnosis not present

## 2017-01-19 DIAGNOSIS — R202 Paresthesia of skin: Secondary | ICD-10-CM | POA: Diagnosis not present

## 2017-01-19 DIAGNOSIS — H539 Unspecified visual disturbance: Secondary | ICD-10-CM | POA: Diagnosis not present

## 2017-02-03 DIAGNOSIS — E871 Hypo-osmolality and hyponatremia: Secondary | ICD-10-CM | POA: Diagnosis not present

## 2017-02-03 DIAGNOSIS — R202 Paresthesia of skin: Secondary | ICD-10-CM | POA: Diagnosis not present

## 2017-02-03 DIAGNOSIS — H539 Unspecified visual disturbance: Secondary | ICD-10-CM | POA: Diagnosis not present

## 2017-02-04 DIAGNOSIS — H04123 Dry eye syndrome of bilateral lacrimal glands: Secondary | ICD-10-CM | POA: Diagnosis not present

## 2017-02-09 ENCOUNTER — Encounter: Payer: Self-pay | Admitting: Sports Medicine

## 2017-02-09 ENCOUNTER — Ambulatory Visit: Payer: Federal, State, Local not specified - PPO | Admitting: Sports Medicine

## 2017-02-09 DIAGNOSIS — M7712 Lateral epicondylitis, left elbow: Secondary | ICD-10-CM | POA: Diagnosis not present

## 2017-02-09 DIAGNOSIS — M25512 Pain in left shoulder: Secondary | ICD-10-CM | POA: Diagnosis not present

## 2017-02-09 MED ORDER — MELOXICAM 15 MG PO TABS: ORAL_TABLET | ORAL | 3 refills | Status: DC

## 2017-02-09 MED FILL — MELOXICAM 15 MG TABLET: 15 | 30 days supply | Qty: 30 | Fill #0

## 2017-02-09 NOTE — Assessment & Plan Note (Signed)
Patient already has a tennis elbow brace. Adding rehab exercises, meloxicam. Return in a month.

## 2017-02-09 NOTE — Progress Notes (Addendum)
    Subjective:    I'm seeing this patient as a consultation for:  Danielle Arnold  CC: Left elbow and wrist pain  HPI: Patient with approximately 2 month history of left elbow and wrist pain. Her pain developed after sustaining a shoulder injury in September. Pain in elbow and wrist worsened after hiking 2 months ago. Patient states that pain is primarily with extension of wrist and supination of forearm. Some pain with flexion of wrist as well. Patient states that pain is worse at night or after repetitive activity. Patient is left handed so day-to-day tasks have become difficult. No occupational or recreational exposures noted.  Otherwise, shoulder issue (from last September has improved).   Past medical history, Surgical history, Family history not pertinant except as noted below, Social history, Allergies, and medications have been entered into the medical record, reviewed, and no changes needed.   Review of Systems: No headache, visual changes, nausea, vomiting, diarrhea, constipation, dizziness, abdominal pain, skin rash, fevers, chills, night sweats, weight loss, swollen lymph nodes, body aches, joint swelling, muscle aches, chest pain, shortness of breath, mood changes, visual or auditory hallucinations.   Objective:    Vitals:   02/09/17 1440  BP: 121/78  Pulse: 61  SpO2: 100%   General: Well Developed, well nourished, and in no acute distress.  Neuro/Psych: Alert and oriented x3, extra-ocular muscles intact, able to move all 4 extremities, sensation grossly intact. Skin: Warm and dry, no rashes noted.  Respiratory: Not using accessory muscles, speaking in full sentences, trachea midline.  Cardiovascular: Pulses palpable, no extremity edema. Abdomen: Does not appear distended. MSK:  L forearm: Normal in appearance without effusion of overlying erythema. No effusion appreciated. Normal passive and active ROM at elbow. Some pain at max flexion at elbow. Pain with  supination of forearm and wrist flexion. Strength 5/5 in supination/pronation, wrist extension/flexion though painful. Speed's produces pain at lateral epicondyle. Negative Yergeson's  No results found for this or any previous visit (from the past 24 hour(s)). No results found.  Impression and Recommendations:    Assessment and Plan: 57 y.o. female with lateral epicondylitis. Patient will benefit from counter-force bracing, therapeutic wrist strengthening exercises, and Meloxicam as needed for pain.  Patient also with some residual pain in shoulder from prior injury, though that is improving.  Patient should return to follow up in 4 weeks. If not better in 4 weeks, we will inject both left shoulder and left epicondyle.    No orders of the defined types were placed in this encounter.  No orders of the defined types were placed in this encounter.   Discussed warning signs or symptoms. Please see discharge instructions. Patient expresses understanding.

## 2017-02-09 NOTE — Assessment & Plan Note (Signed)
Persistent impingement type symptoms, slightly better with rehab exercises, graduating her to the next set of rehab exercises. Return in a month. Subacromial injection if no better.

## 2017-02-11 DIAGNOSIS — R300 Dysuria: Secondary | ICD-10-CM | POA: Diagnosis not present

## 2017-02-11 MED FILL — NITROFURANTOIN MONO-MCR 100: 100 | 5 days supply | Qty: 10 | Fill #0

## 2017-02-16 DIAGNOSIS — N3000 Acute cystitis without hematuria: Secondary | ICD-10-CM | POA: Diagnosis not present

## 2017-03-10 ENCOUNTER — Encounter: Payer: Self-pay | Admitting: Neurology

## 2017-03-10 ENCOUNTER — Ambulatory Visit: Payer: Federal, State, Local not specified - PPO | Admitting: Neurology

## 2017-03-10 VITALS — BP 114/71 | HR 57 | Ht 66.0 in | Wt 123.8 lb

## 2017-03-10 DIAGNOSIS — H5461 Unqualified visual loss, right eye, normal vision left eye: Secondary | ICD-10-CM

## 2017-03-10 DIAGNOSIS — R5382 Chronic fatigue, unspecified: Secondary | ICD-10-CM | POA: Diagnosis not present

## 2017-03-10 DIAGNOSIS — R519 Headache, unspecified: Secondary | ICD-10-CM

## 2017-03-10 DIAGNOSIS — R42 Dizziness and giddiness: Secondary | ICD-10-CM | POA: Diagnosis not present

## 2017-03-10 DIAGNOSIS — R202 Paresthesia of skin: Secondary | ICD-10-CM

## 2017-03-10 DIAGNOSIS — R51 Headache: Secondary | ICD-10-CM

## 2017-03-10 NOTE — Progress Notes (Signed)
[facility]    Provider:  Dr Jaynee Eagles Referring Provider: Lanice Shirts, * Primary Care Physician:  Lanice Shirts, MD  CC:  Dizziness/foggy, pains in legs mostly in calves, sometimes numbness pins/needles in feet.  HPI:  Danielle Arnold is a 57 y.o. female here as a referral from Dr. Coralyn Mark for paresthesias.  Past medical history fatty liver, liver hemangioma, anxiety, high cholesterol, vitamin D insufficiency, hyponatremia, endometriosis, she is never smoked, 1-2 glasses of wine per week. She has dizziness/fogginess at the end of November 2017. She had episodes of frank spinning, now she feels hungover a fogginess and sometimes a headache not significant doesn't need to take anything. She exercises a lot. When she exercises she will get dizzy when she is climbing up a mountain. Recheck of sodium was normal. Always thirsty. She urinates a lot especially at night. She does not sleep well, she wakes often every 2-3 hours unknown why but she urinates. No snoring. No nodding off during the day. She gets to bed around 10pm and wakes between 6-7am. No dizziness just fogginess. She has cramps in her feet and calves. She has numbness and tingling in the feet. She had an episode of vision changes, lasted 3 minutes just in the right eye. No hx of migraines. No Fhx of migraines. Eye exams have been normal. She has dry eye.No mood problems. Some mild anxiety nothing significant.   Reviewed notes, labs and imaging from outside physicians, which showed:  Reviewed referring physician notes.  Patient reporting dizziness and paresthesias.  Reviewed emergency room notes.  Patient was seen at the end of December 2018 for visual disturbances, saw a streak of light in her right eye, vertical streak of bright light across her field of vision in her right eye felt like it was moving.  Lasted about 3 minutes with no recurrence.  When she woke up from a nap she noticed some tingling in  her left hand which resolved after approximately 30 minutes to 45 minutes.  The tingling was in her hand only.  No loss of strength.  She has been having intermittent dull elbow pain for the past 2 months.  She had a bedside ultrasound without evidence of retinal detachment, large floaters, vitreous hemorrhage of the right eye.  She did have an emergency ocular exam.  There was no change in visual acuity.  Her neuro exam was nonfocal.  No visual field cuts on exam.  Bedside ultrasound without evidence of retinal detachment.  A tingling in the left hand was thought to be an ulnar neuropathy.  She was noted to be hyponatremic.  She was asked to follow-up with primary care for this.  BMP showed sodium 127, chloride 95, glucose 102 otherwise normal.  CBC was normal.    Ct showed No acute intracranial abnormalities including mass lesion or mass effect, hydrocephalus, extra-axial fluid collection, midline shift, hemorrhage, or acute infarction, large ischemic events (personally reviewed images)     Review of Systems: Patient complains of symptoms per HPI as well as the following symptoms: dizziness, cramps, spinning sensation. Pertinent negatives and positives per HPI. All others negative.   Social History   Socioeconomic History  . Marital status: Single    Spouse name: Not on file  . Number of children: 0  . Years of education: Not on file  . Highest education level: Some college, no degree  Social Needs  . Financial resource strain: Not on file  . Food insecurity - worry: Not  on file  . Food insecurity - inability: Not on file  . Transportation needs - medical: Not on file  . Transportation needs - non-medical: Not on file  Occupational History  . Not on file  Tobacco Use  . Smoking status: Never Smoker  . Smokeless tobacco: Never Used  Substance and Sexual Activity  . Alcohol use: Yes    Alcohol/week: 0.6 oz    Types: 1 Glasses of wine per week  . Drug use: No  . Sexual activity:  No    Partners: Male  Other Topics Concern  . Not on file  Social History Narrative   Lives at home alone   Left handed   Drinks 1 cup of green tea daily    Family History  Problem Relation Age of Onset  . Dementia Mother   . Cancer Sister        breast  premenopausal  . Cancer Maternal Grandmother        breast  . Sudden death Neg Hx   . Hypertension Neg Hx   . Heart attack Neg Hx   . Hyperlipidemia Neg Hx     Past Medical History:  Diagnosis Date  . Abnormal Pap smear and cervical HPV (human papillomavirus) 1989   Cone biopsy  ????dysplasia  . Anxiety    mild on no meds  . Endometriosis   . Fatty liver   . Fibroid   . Infection    UTI  . Liver hemangioma   . Menopausal hot flushes   . Pure hypercholesterolemia   . Vitamin D insufficiency     Past Surgical History:  Procedure Laterality Date  . APPENDECTOMY  80  . CERVICAL CONE BIOPSY  89  . DILATION AND CURETTAGE OF UTERUS  3/11  . ear operation Bilateral 89 67 68   ear drum repair  . MYOMECTOMY  2000  . UTERINE FIBROID EMBOLIZATION  2003    Current Outpatient Medications  Medication Sig Dispense Refill  . Cholecalciferol (VITAMIN D3) 5000 units CAPS Take 1 capsule by mouth every other day.     . Cyanocobalamin (VITAMIN B-12 PO) Take 1,000 mg by mouth. Take 2-3 times weekly    . Multiple Vitamin (MULTIVITAMIN) tablet Take 1 tablet by mouth daily.    . meloxicam (MOBIC) 15 MG tablet One tab PO qAM with breakfast for 2 weeks, then daily prn pain. (Patient not taking: Reported on 03/10/2017) 30 tablet 3   No current facility-administered medications for this visit.     Allergies as of 03/10/2017 - Review Complete 03/10/2017  Allergen Reaction Noted  . Penicillins Rash 10/31/2010  . Percocet [oxycodone-acetaminophen] Itching 10/31/2010    Vitals: BP 114/71 (BP Location: Right Arm, Patient Position: Sitting)   Pulse (!) 57   Ht 5' 6"  (1.676 m)   Wt 123 lb 12.8 oz (56.2 kg)   LMP 05/14/2010 (Within  Years)   BMI 19.98 kg/m  Last Weight:  Wt Readings from Last 1 Encounters:  03/10/17 123 lb 12.8 oz (56.2 kg)   Last Height:   Ht Readings from Last 1 Encounters:  03/10/17 5' 6"  (1.676 m)    Physical exam: Exam: Gen: NAD, conversant, well nourised, well groomed                     CV: RRR, no MRG. No Carotid Bruits. No peripheral edema, warm, nontender Eyes: Conjunctivae clear without exudates or hemorrhage  Neuro: Detailed Neurologic Exam  Speech:    Speech  is normal; fluent and spontaneous with normal comprehension.  Cognition:    The patient is oriented to person, place, and time;     recent and remote memory intact;     language fluent;     normal attention, concentration,     fund of knowledge Cranial Nerves:    The pupils are equal, round, and reactive to light. The fundi are normal and spontaneous venous pulsations are present. Visual fields are full to finger confrontation. Extraocular movements are intact. Trigeminal sensation is intact and the muscles of mastication are normal. The face is symmetric. The palate elevates in the midline. Hearing intact. Voice is normal. Shoulder shrug is normal. The tongue has normal motion without fasciculations.   Coordination:    Normal finger to nose and heel to shin. Normal rapid alternating movements.   Gait:    Heel-toe and tandem gait are normal.   Motor Observation:    No asymmetry, no atrophy, and no involuntary movements noted. Tone:    Normal muscle tone.    Posture:    Posture is normal. normal erect    Strength:    Strength is V/V in the upper and lower limbs.      Sensation: intact to LT     Reflex Exam:  DTR's:    Deep tendon reflexes in the upper and lower extremities are normal bilaterally.   Toes:    The toes are downgoing bilaterally.   Clonus:    Clonus is absent.      Assessment/Plan:  57 year old female with new onset headaches, "brain fog", fatigue, paresthesias,vision changes,  dizziness/vertigo. Neuro exam non focal.  Need MRI brain to evaluate for lesions such as schwannoma, strokes or other etiologies Will check labs today, neuropathy panel Elbow pain and numbing and tingling in the fingers, he has some exercises, will monitor and consider.  May consider sleep evaluation in the future, discussed good sleep hygiene  Orders Placed This Encounter  Procedures  . MR BRAIN W WO CONTRAST  . Comprehensive metabolic panel  . Hemoglobin A1c  . TSH  . B12 and Folate Panel  . Methylmalonic acid, serum  . Magnesium  . Sedimentation rate  . ANA w/Reflex  . Sjogren's syndrome antibods(ssa + ssb)  . Heavy metals, blood  . Vitamin B6  . Multiple Myeloma Panel (SPEP&IFE w/QIG)  . Vitamin B1  . B. burgdorfi Antibody     Discussed: To prevent or relieve headaches, try the following: Cool Compress. Lie down and place a cool compress on your head.  Avoid headache triggers. If certain foods or odors seem to have triggered your migraines in the past, avoid them. A headache diary might help you identify triggers.  Include physical activity in your daily routine. Try a daily walk or other moderate aerobic exercise.  Manage stress. Find healthy ways to cope with the stressors, such as delegating tasks on your to-do list.  Practice relaxation techniques. Try deep breathing, yoga, massage and visualization.  Eat regularly. Eating regularly scheduled meals and maintaining a healthy diet might help prevent headaches. Also, drink plenty of fluids.  Follow a regular sleep schedule. Sleep deprivation might contribute to headaches Consider biofeedback. With this mind-body technique, you learn to control certain bodily functions - such as muscle tension, heart rate and blood pressure - to prevent headaches or reduce headache pain.    Proceed to emergency room if you experience new or worsening symptoms or symptoms do not resolve, if you have new neurologic  symptoms or if headache is  severe, or for any concerning symptom.   Cc:  Schoenhoff, Altamese Cabal, MD  Sarina Ill, MD  [facility] [address] [place], [place] [address]  Phone [phone] Fax [phone]

## 2017-03-10 NOTE — Patient Instructions (Signed)
MRI brain Labs today

## 2017-03-13 ENCOUNTER — Telehealth: Payer: Self-pay | Admitting: *Deleted

## 2017-03-13 LAB — COMPREHENSIVE METABOLIC PANEL
ALT: 12 IU/L (ref 0–32)
AST: 24 IU/L (ref 0–40)
Albumin/Globulin Ratio: 1.7 (ref 1.2–2.2)
Albumin: 4.6 g/dL (ref 3.5–5.5)
Alkaline Phosphatase: 85 IU/L (ref 39–117)
BILIRUBIN TOTAL: 0.7 mg/dL (ref 0.0–1.2)
BUN / CREAT RATIO: 10 (ref 9–23)
BUN: 8 mg/dL (ref 6–24)
CHLORIDE: 98 mmol/L (ref 96–106)
CO2: 24 mmol/L (ref 20–29)
Calcium: 9.8 mg/dL (ref 8.7–10.2)
Creatinine, Ser: 0.78 mg/dL (ref 0.57–1.00)
GFR calc non Af Amer: 84 mL/min/{1.73_m2} (ref >59)
GFR, EST AFRICAN AMERICAN: 97 mL/min/{1.73_m2} (ref >59)
Globulin, Total: 2.7 g/dL (ref 1.5–4.5)
Glucose: 81 mg/dL (ref 65–99)
Potassium: 4.5 mmol/L (ref 3.5–5.2)
Sodium: 137 mmol/L (ref 134–144)
TOTAL PROTEIN: 7.3 g/dL (ref 6.0–8.5)

## 2017-03-13 LAB — VITAMIN B1: Thiamine: 142.4 nmol/L (ref 66.5–200.0)

## 2017-03-13 LAB — MULTIPLE MYELOMA PANEL, SERUM
ALBUMIN/GLOB SERPL: 1.3 (ref 0.7–1.7)
ALPHA 1: 0.2 g/dL (ref 0.0–0.4)
ALPHA2 GLOB SERPL ELPH-MCNC: 0.7 g/dL (ref 0.4–1.0)
Albumin SerPl Elph-Mcnc: 4.1 g/dL (ref 2.9–4.4)
B-GLOBULIN SERPL ELPH-MCNC: 1 g/dL (ref 0.7–1.3)
GAMMA GLOB SERPL ELPH-MCNC: 1.2 g/dL (ref 0.4–1.8)
GLOBULIN, TOTAL: 3.2 g/dL (ref 2.2–3.9)
IGG (IMMUNOGLOBIN G), SERUM: 1209 mg/dL (ref 700–1600)
IgA/Immunoglobulin A, Serum: 262 mg/dL (ref 87–352)
IgM (Immunoglobulin M), Srm: 47 mg/dL (ref 26–217)

## 2017-03-13 LAB — HEMOGLOBIN A1C
Est. average glucose Bld gHb Est-mCnc: 94 mg/dL
Hgb A1c MFr Bld: 4.9 % (ref 4.8–5.6)

## 2017-03-13 LAB — METHYLMALONIC ACID, SERUM: METHYLMALONIC ACID: 152 nmol/L (ref 0–378)

## 2017-03-13 LAB — B12 AND FOLATE PANEL
Folate: >20 ng/mL (ref >3.0)
VITAMIN B 12: 556 pg/mL (ref 232–1245)

## 2017-03-13 LAB — B. BURGDORFI ANTIBODIES: Lyme IgG/IgM Ab: <0.91 {ISR} (ref 0.00–0.90)

## 2017-03-13 LAB — SJOGREN'S SYNDROME ANTIBODS(SSA + SSB)
ENA SSA (RO) AB: 0.3 AI (ref 0.0–0.9)
ENA SSB (LA) Ab: <0.2 AI (ref 0.0–0.9)

## 2017-03-13 LAB — SEDIMENTATION RATE: Sed Rate: 2 mm/hr (ref 0–40)

## 2017-03-13 LAB — VITAMIN B6: Vitamin B6: 54.6 ug/L — ABNORMAL HIGH (ref 2.0–32.8)

## 2017-03-13 LAB — MAGNESIUM: MAGNESIUM: 1.9 mg/dL (ref 1.6–2.3)

## 2017-03-13 LAB — ANA W/REFLEX: Anti Nuclear Antibody(ANA): NEGATIVE

## 2017-03-13 LAB — HEAVY METALS, BLOOD
Arsenic: 6 ug/L (ref 2–23)
Lead, Blood: 1 ug/dL (ref 0–4)
MERCURY: NOT DETECTED ug/L (ref 0.0–14.9)

## 2017-03-13 LAB — TSH: TSH: 1.7 u[IU]/mL (ref 0.450–4.500)

## 2017-03-13 NOTE — Telephone Encounter (Signed)
Spoke with patient. She is aware that so far labs are normal, awaiting one more. Will call pt if abnormal. She verbalized appreciation. She is aware that labs are on mychart for review. Also can come to office and get copy if she wishes.

## 2017-03-13 NOTE — Telephone Encounter (Signed)
-----   Message from Melvenia Beam, MD sent at 03/13/2017 10:49 AM EST ----- Labs normal there is only one left and if that comes back abnormal we will call her however all labs look great thanks

## 2017-03-14 ENCOUNTER — Ambulatory Visit (HOSPITAL_BASED_OUTPATIENT_CLINIC_OR_DEPARTMENT_OTHER): Payer: Federal, State, Local not specified - PPO

## 2017-03-17 NOTE — Telephone Encounter (Signed)
Called pt & LVM (ok per DPR) informing patient that her B6 came back a little elevated. I asked the patient if she is taking B6 supplement. Informed pt to ensure that she is not taking megadoses (>100mg  a day) as B6 can be toxic at high levels. However Dr. Jaynee Eagles does not think this has anything to do with her symptoms. RN encouraged pt to call back to discuss and left office number in message.     Notes recorded by Melvenia Beam, MD on 03/14/2017 at 12:09 PM EST B6 came back a little elevated. Is she taking b6 supplement? Just ensure not taking megadoses (>100mg  a day) as B6 can be toxic at high levels. However I don;t think this has anything to do with her symptoms thanks

## 2017-03-18 NOTE — Telephone Encounter (Signed)
Pt returning RN's call.

## 2017-03-18 NOTE — Telephone Encounter (Signed)
Called pt back. She stated she takes a supplement that is high in B vitamins, however it says to take 2 per day and she only takes 1 per day occasionally. She stated she eats a lot of plant based things, so she probably doesn't even need the multivitamin. She had no further concerns. MRI is scheduled for this coming Saturday 3/9. Pt aware RN will call next week with results. She is also aware to return if symptoms worsen or fail to improve.

## 2017-03-21 ENCOUNTER — Ambulatory Visit (HOSPITAL_BASED_OUTPATIENT_CLINIC_OR_DEPARTMENT_OTHER)
Admission: RE | Admit: 2017-03-21 | Discharge: 2017-03-21 | Disposition: A | Discharge status: Home / Self Care | Payer: Federal, State, Local not specified - PPO | Source: Ambulatory Visit | Attending: Neurology | Admitting: Neurology

## 2017-03-21 DIAGNOSIS — R519 Headache, unspecified: Secondary | ICD-10-CM

## 2017-03-21 DIAGNOSIS — R202 Paresthesia of skin: Secondary | ICD-10-CM

## 2017-03-21 DIAGNOSIS — R42 Dizziness and giddiness: Secondary | ICD-10-CM | POA: Diagnosis not present

## 2017-03-21 DIAGNOSIS — R51 Headache: Secondary | ICD-10-CM | POA: Diagnosis not present

## 2017-03-21 DIAGNOSIS — R5382 Chronic fatigue, unspecified: Secondary | ICD-10-CM

## 2017-03-21 DIAGNOSIS — H5461 Unqualified visual loss, right eye, normal vision left eye: Secondary | ICD-10-CM | POA: Diagnosis not present

## 2017-03-21 DIAGNOSIS — R5383 Other fatigue: Secondary | ICD-10-CM | POA: Insufficient documentation

## 2017-03-21 MED ORDER — GADOBENATE DIMEGLUMINE 529 MG/ML IV SOLN
10.0000 mL | Freq: Once | INTRAVENOUS | Status: AC | PRN
  Administered 2017-03-21: 10 mL via INTRAVENOUS

## 2017-03-24 ENCOUNTER — Encounter: Payer: Self-pay | Admitting: *Deleted

## 2017-04-13 DIAGNOSIS — K080 Exfoliation of teeth due to systemic causes: Secondary | ICD-10-CM | POA: Diagnosis not present

## 2017-04-15 NOTE — Telephone Encounter (Signed)
Called the patient and discussed her MRI results, normal for her age, no concerns. RN informed pt that a message was sent awhile back through mychart. The patient stated she had been on mychart but didn't know how to read the message. She verbalized appreciation for the call and had no questions.

## 2017-04-20 DIAGNOSIS — M5030 Other cervical disc degeneration, unspecified cervical region: Secondary | ICD-10-CM | POA: Diagnosis not present

## 2017-04-20 DIAGNOSIS — M7542 Impingement syndrome of left shoulder: Secondary | ICD-10-CM | POA: Diagnosis not present

## 2017-04-20 DIAGNOSIS — G8929 Other chronic pain: Secondary | ICD-10-CM | POA: Diagnosis not present

## 2017-05-19 ENCOUNTER — Other Ambulatory Visit: Payer: Self-pay

## 2017-05-19 ENCOUNTER — Emergency Department (HOSPITAL_BASED_OUTPATIENT_CLINIC_OR_DEPARTMENT_OTHER)
Admission: EM | Admit: 2017-05-19 | Discharge: 2017-05-19 | Disposition: A | Discharge status: Home / Self Care | Payer: Federal, State, Local not specified - PPO | Attending: Emergency Medicine | Admitting: Emergency Medicine

## 2017-05-19 ENCOUNTER — Emergency Department (HOSPITAL_BASED_OUTPATIENT_CLINIC_OR_DEPARTMENT_OTHER): Payer: Federal, State, Local not specified - PPO

## 2017-05-19 ENCOUNTER — Encounter (HOSPITAL_BASED_OUTPATIENT_CLINIC_OR_DEPARTMENT_OTHER): Payer: Self-pay | Admitting: *Deleted

## 2017-05-19 DIAGNOSIS — W01198A Fall on same level from slipping, tripping and stumbling with subsequent striking against other object, initial encounter: Secondary | ICD-10-CM | POA: Insufficient documentation

## 2017-05-19 DIAGNOSIS — S42215A Unspecified nondisplaced fracture of surgical neck of left humerus, initial encounter for closed fracture: Secondary | ICD-10-CM | POA: Diagnosis not present

## 2017-05-19 DIAGNOSIS — Y998 Other external cause status: Secondary | ICD-10-CM | POA: Insufficient documentation

## 2017-05-19 DIAGNOSIS — Y92828 Other wilderness area as the place of occurrence of the external cause: Secondary | ICD-10-CM | POA: Insufficient documentation

## 2017-05-19 DIAGNOSIS — Y9301 Activity, walking, marching and hiking: Secondary | ICD-10-CM | POA: Diagnosis not present

## 2017-05-19 DIAGNOSIS — F419 Anxiety disorder, unspecified: Secondary | ICD-10-CM | POA: Diagnosis not present

## 2017-05-19 DIAGNOSIS — Z79899 Other long term (current) drug therapy: Secondary | ICD-10-CM | POA: Diagnosis not present

## 2017-05-19 DIAGNOSIS — S42302A Unspecified fracture of shaft of humerus, left arm, initial encounter for closed fracture: Secondary | ICD-10-CM | POA: Diagnosis not present

## 2017-05-19 DIAGNOSIS — S42212A Unspecified displaced fracture of surgical neck of left humerus, initial encounter for closed fracture: Secondary | ICD-10-CM | POA: Insufficient documentation

## 2017-05-19 DIAGNOSIS — S4992XA Unspecified injury of left shoulder and upper arm, initial encounter: Secondary | ICD-10-CM | POA: Diagnosis not present

## 2017-05-19 MED ORDER — ACETAMINOPHEN 500 MG PO TABS: 1000.0000 mg | ORAL_TABLET | Freq: Three times a day (TID) | ORAL | 0 refills | Status: AC

## 2017-05-19 MED ORDER — HYDROCODONE-ACETAMINOPHEN 5-325 MG PO TABS
1.0000 | ORAL_TABLET | Freq: Once | ORAL | Status: AC
  Administered 2017-05-19: 1 via ORAL
  Filled 2017-05-19: qty 1

## 2017-05-19 MED ORDER — HYDROCODONE-ACETAMINOPHEN 5-325 MG PO TABS
1.0000 | ORAL_TABLET | Freq: Three times a day (TID) | ORAL | 0 refills | Status: AC | PRN
Start: 2017-05-19 — End: 2017-05-24

## 2017-05-19 MED ORDER — CYCLOBENZAPRINE HCL 10 MG PO TABS: 5.0000 mg | ORAL_TABLET | Freq: Every day | ORAL | 0 refills | Status: AC

## 2017-05-19 MED FILL — HYDROCODON-APAP 5-325: 5-325 | 5 days supply | Qty: 15 | Fill #0

## 2017-05-19 MED FILL — CYCLOBENZAPRINE HCL 10 MG T: 10 | 10 days supply | Qty: 10 | Fill #0

## 2017-05-19 MED FILL — ACETAMINOPHEN 500 MG TABS: 500 | 17 days supply | Qty: 100 | Fill #0

## 2017-05-19 NOTE — ED Notes (Signed)
Pt had a vaso vagal reaction at triage but recovered quickly without intervention.

## 2017-05-19 NOTE — ED Triage Notes (Signed)
She was hiking and fell. Injury to her left shoulder. Deformity noted.

## 2017-05-19 NOTE — ED Provider Notes (Signed)
[facility] Provider Note  CSN: [id number] Arrival date & time: 05/19/17 1355  Chief Complaint(s) Fall  HPI Danielle Arnold is a 57 y.o. female who presents with left shoulder pain after a mechanical fall while hiking.  She reports that she tripped over a root causing her to fall forward.  She landed on her outstretched arm and shoulder.  Complains of only left shoulder pain that is aching and throbbing in nature.  Exacerbated with movement.  Relieved with immobilization.  Denies any other trauma or physical complaints.  Patient is not anticoagulated.  She denies any headache, neck pain, back pain, chest pain, abdominal pain, right upper and bilateral lower extremity pain.  HPI  Past Medical History Past Medical History:  Diagnosis Date  . Abnormal Pap smear and cervical HPV (human papillomavirus) 1989   Cone biopsy  ????dysplasia  . Anxiety    mild on no meds  . Endometriosis   . Fatty liver   . Fibroid   . Infection    UTI  . Liver hemangioma   . Menopausal hot flushes   . Pure hypercholesterolemia   . Vitamin D insufficiency    Patient Active Problem List   Diagnosis Date Noted  . Lateral epicondylitis, left elbow 02/09/2017  . Metatarsalgia, left foot 10/09/2016  . Left shoulder pain 10/09/2016  . Right patellofemoral syndrome 12/28/2014  . Osteoarthritis of left hip 08/18/2013  . Hyperlipidemia 12/31/2011  . Tinea 12/25/2011  . Constipation 12/25/2011  . Vaginal dryness 12/25/2011  . Left lumbar radiculitis 11/27/2011  . Sciatica 11/18/2011  . Plantar fasciitis 07/06/2011  . Heel spur 07/06/2011  . Left leg injury 02/10/2011  . Dermatitis 02/10/2011  . Anemia 11/21/2010  . Irritable bowel 11/21/2010  . FH: breast cancer in first degree relative 10/31/2010  . Anxiety   . Menopausal hot flushes    Home Medication(s) Prior to Admission medications   Medication Sig Start Date End Date Taking? Authorizing Provider  acetaminophen  (TYLENOL) 500 MG tablet Take 2 tablets (1,000 mg total) by mouth every 8 (eight) hours for 5 days. Do not take more than 4000 mg of acetaminophen (Tylenol) in a 24-hour period. Please note that other medicines that you may be prescribed may have Tylenol as well. 05/19/17 05/24/17  Fatima [facility], MD  Cholecalciferol (VITAMIN D3) 5000 units CAPS Take 1 capsule by mouth every other day.     [provider]  Cyanocobalamin (VITAMIN B-12 PO) Take 1,000 mg by mouth. Take 2-3 times weekly    [provider]  cyclobenzaprine (FLEXERIL) 10 MG tablet Take 0.5-1 tablets (5-10 mg total) by mouth at bedtime for 10 days. 05/19/17 05/29/17  Fatima Blank, MD  HYDROcodone-acetaminophen (NORCO/VICODIN) 5-325 MG tablet Take 1 tablet by mouth every 8 (eight) hours as needed for up to 5 days for severe pain (That is not improved by your scheduled acetaminophen regimen). Please do not exceed 4000 mg of acetaminophen (Tylenol) a 24-hour period. Please note that he may be prescribed additional medicine that contains acetaminophen. 05/19/17 05/24/17  Fatima Blank, MD  meloxicam (MOBIC) 15 MG tablet One tab PO qAM with breakfast for 2 weeks, then daily prn pain. Patient not taking: Reported on 03/10/2017 02/09/17   Silverio Decamp, MD  Multiple Vitamin (MULTIVITAMIN) tablet Take 1 tablet by mouth daily.    [provider]  Past Surgical History Past Surgical History:  Procedure Laterality Date  . APPENDECTOMY  80  . CERVICAL CONE BIOPSY  89  . DILATION AND CURETTAGE OF UTERUS  3/11  . ear operation Bilateral 89 67 68   ear drum repair  . MYOMECTOMY  2000  . UTERINE FIBROID EMBOLIZATION  2003   Family History Family History  Problem Relation Age of Onset  . Dementia Mother   . Cancer Sister        breast  premenopausal  . Cancer Maternal  Grandmother        breast  . Sudden death Neg Hx   . Hypertension Neg Hx   . Heart attack Neg Hx   . Hyperlipidemia Neg Hx     Social History Social History   Tobacco Use  . Smoking status: Never Smoker  . Smokeless tobacco: Never Used  Substance Use Topics  . Alcohol use: Yes    Alcohol/week: 0.6 oz    Types: 1 Glasses of wine per week  . Drug use: No   Allergies Penicillins and Percocet [oxycodone-acetaminophen]  Review of Systems Review of Systems All other systems are reviewed and are negative for acute change except as noted in the HPI  Physical Exam Vital Signs  I have reviewed the triage vital signs BP 100/60   Pulse (!) 51   Temp 98.1 F (36.7 C) (Oral)   Resp 16   Ht 5\' 6"  (1.676 m)   Wt 56.7 kg (125 lb)   LMP 05/14/2010 (Within Years)   SpO2 99%   BMI 20.18 kg/m   Physical Exam  Constitutional: She is oriented to person, place, and time. She appears well-developed and well-nourished. No distress.  HENT:  Head: Normocephalic and atraumatic.  Right Ear: External ear normal.  Left Ear: External ear normal.  Nose: Nose normal.  Eyes: Pupils are equal, round, and reactive to light. Conjunctivae and EOM are normal. Right eye exhibits no discharge. Left eye exhibits no discharge. No scleral icterus.  Neck: Normal range of motion. Neck supple.  Cardiovascular: Normal rate, regular rhythm and normal heart sounds. Exam reveals no gallop and no friction rub.  No murmur heard. Pulses:      Radial pulses are 2+ on the right side, and 2+ on the left side.       Dorsalis pedis pulses are 2+ on the right side, and 2+ on the left side.  Pulmonary/Chest: Effort normal and breath sounds normal. No stridor. No respiratory distress. She has no wheezes.  Abdominal: Soft. She exhibits no distension. There is no tenderness.  Musculoskeletal: She exhibits no edema.       Left shoulder: She exhibits decreased range of motion, tenderness, bony tenderness and pain. She  exhibits normal pulse and normal strength.       Left elbow: No tenderness found.       Left wrist: She exhibits normal range of motion and no tenderness.       Cervical back: She exhibits no bony tenderness.       Thoracic back: She exhibits no bony tenderness.       Lumbar back: She exhibits no bony tenderness.       Left forearm: She exhibits no tenderness.       Arms:      Left hand: She exhibits normal range of motion and no tenderness. Normal sensation noted. Normal strength noted.  Clavicles stable. Chest stable to AP/Lat compression. Pelvis stable to Lat compression. No obvious extremity  deformity. No chest or abdominal wall contusion.  Neurological: She is alert and oriented to person, place, and time.  Moving all extremities  Skin: Skin is warm and dry. No rash noted. She is not diaphoretic. No erythema.  Psychiatric: She has a normal mood and affect.    ED Results and Treatments Labs (all labs ordered are listed, but only abnormal results are displayed) Labs Reviewed - No data to display                                                                                                                       EKG  EKG Interpretation  Date/Time:    Ventricular Rate:    PR Interval:    QRS Duration:   QT Interval:    QTC Calculation:   R Axis:     Text Interpretation:        Radiology Dg Shoulder Left  Result Date: 05/19/2017 CLINICAL DATA:  Fall with left shoulder pain EXAM: LEFT SHOULDER - 2+ VIEW COMPARISON:  None FINDINGS: There is a nondisplaced fracture of the proximal left humeral neck. Glenohumeral joint remains approximated. Mild-to-moderate acromioclavicular osteoarthrosis without dislocation or clavicle fracture. IMPRESSION: Nondisplaced fracture of the proximal left humerus, at the humeral neck. No dislocation. Electronically Signed   By: Ulyses Jarred M.D.   On: 05/19/2017 15:05   Pertinent labs & imaging results that were available during my care of the  patient were reviewed by me and considered in my medical decision making (see chart for details).  Medications Ordered in ED Medications  HYDROcodone-acetaminophen (NORCO/VICODIN) 5-325 MG per tablet 1 tablet (has no administration in time range)                                                                                                                                    Procedures Procedures  (including critical care time)  Medical Decision Making / ED Course I have reviewed the nursing notes for this encounter and the patient's prior records (if available in EHR or on provided paperwork).    Left shoulder pain following mechanical fall.  Plain film confirming a nondisplaced humeral neck fracture.  No other injuries noted on exam requiring imaging or work-up.  Patient  provided with oral pain medicine and sling, which is definitive management.  States that she already has an orthopedist to follow-up.  The patient appears reasonably screened and/or stabilized for discharge and  I doubt any other medical condition or other [facility] requiring further screening, evaluation, or treatment in the ED at this time prior to discharge.   Final Clinical Impression(s) / ED Diagnoses Final diagnoses:  Closed displaced fracture of surgical neck of left humerus, unspecified fracture morphology, initial encounter    Disposition: Discharge  Condition: Good  I have discussed the results, Dx and Tx plan with the patient who expressed understanding and agree(s) with the plan. Discharge instructions discussed at great length. The patient was given strict return precautions who verbalized understanding of the instructions. No further questions at time of discharge.    ED Discharge Orders        Ordered    acetaminophen (TYLENOL) 500 MG tablet  Every 8 hours     05/19/17 1551    HYDROcodone-acetaminophen (NORCO/VICODIN) 5-325 MG tablet  Every 8 hours PRN     05/19/17 1551    cyclobenzaprine (FLEXERIL)  10 MG tablet  Daily at bedtime     05/19/17 1551       Follow Up: Lanice Shirts, MD [address] [place] [address] [phone]  Schedule an appointment as soon as possible for a visit  in 5-7 days for continued pain management  Orthopedic surgery   in 1-2 weeks to follow up for humerus fracture     This chart was dictated using voice recognition software.  Despite best efforts to proofread,  errors can occur which can change the documentation meaning.   Fatima Blank, MD 05/19/17 [phone]

## 2017-05-21 DIAGNOSIS — W19XXXA Unspecified fall, initial encounter: Secondary | ICD-10-CM | POA: Diagnosis not present

## 2017-05-21 DIAGNOSIS — S42309S Unspecified fracture of shaft of humerus, unspecified arm, sequela: Secondary | ICD-10-CM | POA: Diagnosis not present

## 2017-05-21 DIAGNOSIS — R42 Dizziness and giddiness: Secondary | ICD-10-CM | POA: Diagnosis not present

## 2017-05-29 ENCOUNTER — Ambulatory Visit (INDEPENDENT_AMBULATORY_CARE_PROVIDER_SITE_OTHER): Payer: Federal, State, Local not specified - PPO | Admitting: Orthopaedic Surgery

## 2017-05-29 ENCOUNTER — Encounter (INDEPENDENT_AMBULATORY_CARE_PROVIDER_SITE_OTHER): Payer: Self-pay | Admitting: Orthopaedic Surgery

## 2017-05-29 VITALS — Ht 66.0 in | Wt 125.0 lb

## 2017-05-29 DIAGNOSIS — S42295A Other nondisplaced fracture of upper end of left humerus, initial encounter for closed fracture: Secondary | ICD-10-CM

## 2017-05-29 MED ORDER — ZINC SULFATE 220 (50 ZN) MG PO CAPS: 220.0000 mg | ORAL_CAPSULE | Freq: Every day | ORAL | 1 refills | Status: DC

## 2017-05-29 MED ORDER — HYDROCODONE-ACETAMINOPHEN 5-325 MG PO TABS: 1.0000 | ORAL_TABLET | Freq: Two times a day (BID) | ORAL | 0 refills | Status: DC | PRN

## 2017-05-29 MED ORDER — CALCIUM CARBONATE-VITAMIN D 500-200 MG-UNIT PO TABS: 1.0000 | ORAL_TABLET | Freq: Three times a day (TID) | ORAL | 2 refills | Status: DC

## 2017-05-29 MED ORDER — IBUPROFEN-FAMOTIDINE 800-26.6 MG PO TABS: ORAL_TABLET | ORAL | 6 refills | Status: DC

## 2017-05-29 MED FILL — OYSTER SHELL 500-VIT D3 200: 500-200 | 50 days supply | Qty: 150 | Fill #0

## 2017-05-29 MED FILL — ZINC 50 MG CAPSULE: 50 | 100 days supply | Qty: 100 | Fill #0

## 2017-05-29 NOTE — Progress Notes (Signed)
Office Visit Note   Patient: Danielle Arnold           Date of Birth: May 23, 1960           MRN: [id number] Visit Date: 05/29/2017              Requested by: Lanice Shirts, MD [address] [place], [place] [address] PCP: Lanice Shirts, MD   Assessment & Plan: Visit Diagnoses:  1. Other closed nondisplaced fracture of proximal end of left humerus, initial encounter     Plan: Impression is minimally displaced left proximal humerus fracture.  This should be amenable to conservative treatment.  We will place the patient in a sling nonweightbearing.  She can come out of the sling when she is 3 weeks out from the injury and start pendulum swings.  She will follow-up with [place] in 2 weeks time for repeat x-rays.  In the meantime, we will call and Os-Cal, zinc as well as Duexis and hydrocodone to use sparingly.  She will contact her primary care provider for further work-up of osteoporosis.  Call with concerns or questions in the meantime.  Follow-Up Instructions: Return in about 2 weeks (around 06/12/2017).   Orders:  No orders of the defined types were placed in this encounter.  Meds ordered this encounter  Medications  . calcium-vitamin D (OSCAL 500/200 D-3) 500-200 MG-UNIT tablet    Sig: Take 1 tablet by mouth 3 (three) times daily.    Dispense:  90 tablet    Refill:  2  . HYDROcodone-acetaminophen (NORCO) 5-325 MG tablet    Sig: Take 1 tablet by mouth 2 (two) times daily as needed for moderate pain.    Dispense:  10 tablet    Refill:  0  . zinc sulfate 220 (50 Zn) MG capsule    Sig: Take 1 capsule (220 mg total) by mouth daily.    Dispense:  30 capsule    Refill:  1  . Ibuprofen-Famotidine (DUEXIS) 800-26.6 MG TABS    Sig: Take as directed prn pain    Dispense:  90 tablet    Refill:  6      Procedures: No procedures performed   Clinical Data: No additional findings.   Subjective: Chief Complaint  Patient presents with  . injury    Lt  humerus having pain, uncomfortable feeling, tightness.    HPI patient is a pleasant 57 year old female who presents to our clinic today with a new injury to her left arm.  On 05/19/2017, she was hiking in the woods when she tripped over a root falling on her left shoulder.  She was seen at [facility] on 05/19/2017 where x-rays were done.  It showed a left minimally displaced proximal humerus fracture.  She was placed in a sling nonweightbearing.  She comes in today for follow-up.  She is having a fair amount of pain as well as tightness worse at night.  She has been compliant with sling.  No numbness, tingling or burning.  She does note a previous fracture to the right humerus few years back.  She is on vitamin D.  She has never been worked up for osteoporosis.  Review of Systems as detailed in HPI.  All others reviewed and are negative.   Objective: Vital Signs: Ht 5\' 6"  (1.676 m)   Wt 125 lb (56.7 kg)   LMP 05/14/2010 (Within Years)   BMI 20.18 kg/m   Physical Exam well-developed well-nourished female  in no acute distress.  Alert and oriented x3.  Ortho Exam examination of her left upper extremity reveals moderate tenderness palpation of the proximal humerus.  Radial nerve is intact.  She does have moderate swelling to the left hand.  Specialty Comments:  No specialty comments available.  Imaging: No new imaging today   PMFS History: Patient Active Problem List   Diagnosis Date Noted  . Lateral epicondylitis, left elbow 02/09/2017  . Metatarsalgia, left foot 10/09/2016  . Left shoulder pain 10/09/2016  . Right patellofemoral syndrome 12/28/2014  . Osteoarthritis of left hip 08/18/2013  . Hyperlipidemia 12/31/2011  . Tinea 12/25/2011  . Constipation 12/25/2011  . Vaginal dryness 12/25/2011  . Left lumbar radiculitis 11/27/2011  . Sciatica 11/18/2011  . Plantar fasciitis 07/06/2011  . Heel spur 07/06/2011  . Left leg injury 02/10/2011  . Dermatitis 02/10/2011  .  Anemia 11/21/2010  . Irritable bowel 11/21/2010  . FH: breast cancer in first degree relative 10/31/2010  . Anxiety   . Menopausal hot flushes    Past Medical History:  Diagnosis Date  . Abnormal Pap smear and cervical HPV (human papillomavirus) 1989   Cone biopsy  ????dysplasia  . Anxiety    mild on no meds  . Endometriosis   . Fatty liver   . Fibroid   . Infection    UTI  . Liver hemangioma   . Menopausal hot flushes   . Pure hypercholesterolemia   . Vitamin D insufficiency     Family History  Problem Relation Age of Onset  . Dementia Mother   . Cancer Sister        breast  premenopausal  . Cancer Maternal Grandmother        breast  . Sudden death Neg Hx   . Hypertension Neg Hx   . Heart attack Neg Hx   . Hyperlipidemia Neg Hx     Past Surgical History:  Procedure Laterality Date  . APPENDECTOMY  80  . CERVICAL CONE BIOPSY  89  . DILATION AND CURETTAGE OF UTERUS  3/11  . ear operation Bilateral 89 67 68   ear drum repair  . MYOMECTOMY  2000  . UTERINE FIBROID EMBOLIZATION  2003   Social History   Occupational History  . Not on file  Tobacco Use  . Smoking status: Never Smoker  . Smokeless tobacco: Never Used  Substance and Sexual Activity  . Alcohol use: Yes    Alcohol/week: 0.6 oz    Types: 1 Glasses of wine per week  . Drug use: No  . Sexual activity: Never    Partners: Male

## 2017-06-12 ENCOUNTER — Encounter (INDEPENDENT_AMBULATORY_CARE_PROVIDER_SITE_OTHER): Payer: Self-pay | Admitting: Orthopaedic Surgery

## 2017-06-12 ENCOUNTER — Ambulatory Visit (INDEPENDENT_AMBULATORY_CARE_PROVIDER_SITE_OTHER): Payer: Federal, State, Local not specified - PPO | Admitting: Orthopaedic Surgery

## 2017-06-12 ENCOUNTER — Ambulatory Visit (INDEPENDENT_AMBULATORY_CARE_PROVIDER_SITE_OTHER): Payer: Federal, State, Local not specified - PPO

## 2017-06-12 DIAGNOSIS — M25532 Pain in left wrist: Secondary | ICD-10-CM

## 2017-06-12 DIAGNOSIS — G8929 Other chronic pain: Secondary | ICD-10-CM

## 2017-06-12 DIAGNOSIS — M25512 Pain in left shoulder: Secondary | ICD-10-CM | POA: Diagnosis not present

## 2017-06-12 DIAGNOSIS — S42295A Other nondisplaced fracture of upper end of left humerus, initial encounter for closed fracture: Secondary | ICD-10-CM | POA: Diagnosis not present

## 2017-06-12 NOTE — Progress Notes (Signed)
Office Visit Note   Patient: Danielle Arnold           Date of Birth: 07-11-1960           MRN: [id number] Visit Date: 06/12/2017              Requested by: Lanice Shirts, MD [address] [place], [place] [address] PCP: Lanice Shirts, MD   Assessment & Plan: Visit Diagnoses:  1. Chronic left shoulder pain   2. Other closed nondisplaced fracture of proximal end of left humerus, initial encounter   3. Pain in left wrist     Plan: Impression is left wrist sprain and stable left proximal humerus fracture.  Begin pendulum exercises at this point.  Recommend NSAIDs as needed for the wrist and ice and symptomatic treatment.  Follow-up in 3 weeks with AP and scapular Y x-rays of the left shoulder.  Follow-Up Instructions: Return in about 3 weeks (around 07/03/2017).   Orders:  Orders Placed This Encounter  Procedures  . XR Shoulder Left  . XR Wrist Complete Left   No orders of the defined types were placed in this encounter.     Procedures: No procedures performed   Clinical Data: No additional findings.   Subjective: Chief Complaint  Patient presents with  . Left Shoulder - Pain, Follow-up    HUMERUS  . Left Wrist - Pain    Tercero is 3 weeks status post proximal humerus fracture.  She is also complaining of left wrist pain.  She originally had on her outstretched hand before she rolled over to the left side.   Review of Systems   Objective: Vital Signs: LMP 05/14/2010 (Within Years)   Physical Exam  Ortho Exam Left wrist exam shows some limitation in range of motion secondary to pain and some stiffness.  No focal motor or sensory deficits. Specialty Comments:  No specialty comments available.  Imaging: Xr Wrist Complete Left  Result Date: 06/12/2017 Generalized osteopenia.  No acute abnormalities.  Mild osteoarthritis  Xr Shoulder Left  Result Date: 06/12/2017 Stable alignment of proximal humerus fracture with some  evidence of early callus formation    PMFS History: Patient Active Problem List   Diagnosis Date Noted  . Lateral epicondylitis, left elbow 02/09/2017  . Metatarsalgia, left foot 10/09/2016  . Left shoulder pain 10/09/2016  . Right patellofemoral syndrome 12/28/2014  . Osteoarthritis of left hip 08/18/2013  . Hyperlipidemia 12/31/2011  . Tinea 12/25/2011  . Constipation 12/25/2011  . Vaginal dryness 12/25/2011  . Left lumbar radiculitis 11/27/2011  . Sciatica 11/18/2011  . Plantar fasciitis 07/06/2011  . Heel spur 07/06/2011  . Left leg injury 02/10/2011  . Dermatitis 02/10/2011  . Anemia 11/21/2010  . Irritable bowel 11/21/2010  . FH: breast cancer in first degree relative 10/31/2010  . Anxiety   . Menopausal hot flushes    Past Medical History:  Diagnosis Date  . Abnormal Pap smear and cervical HPV (human papillomavirus) 1989   Cone biopsy  ????dysplasia  . Anxiety    mild on no meds  . Endometriosis   . Fatty liver   . Fibroid   . Infection    UTI  . Liver hemangioma   . Menopausal hot flushes   . Pure hypercholesterolemia   . Vitamin D insufficiency     Family History  Problem Relation Age of Onset  . Dementia Mother   . Cancer Sister        breast  premenopausal  . Cancer Maternal Grandmother        breast  . Sudden death Neg Hx   . Hypertension Neg Hx   . Heart attack Neg Hx   . Hyperlipidemia Neg Hx     Past Surgical History:  Procedure Laterality Date  . APPENDECTOMY  80  . CERVICAL CONE BIOPSY  89  . DILATION AND CURETTAGE OF UTERUS  3/11  . ear operation Bilateral 89 67 68   ear drum repair  . MYOMECTOMY  2000  . UTERINE FIBROID EMBOLIZATION  2003   Social History   Occupational History  . Not on file  Tobacco Use  . Smoking status: Never Smoker  . Smokeless tobacco: Never Used  Substance and Sexual Activity  . Alcohol use: Yes    Alcohol/week: 0.6 oz    Types: 1 Glasses of wine per week  . Drug use: No  . Sexual activity: Never      Partners: Male

## 2017-06-16 ENCOUNTER — Telehealth (INDEPENDENT_AMBULATORY_CARE_PROVIDER_SITE_OTHER): Payer: Self-pay | Admitting: Orthopaedic Surgery

## 2017-06-16 NOTE — Telephone Encounter (Signed)
Yes she will be cleared to go to PT after the next office visit.

## 2017-06-16 NOTE — Telephone Encounter (Signed)
See message below. Do you only want Begin pendulum exercises at this point. Per your note.?

## 2017-06-16 NOTE — Telephone Encounter (Signed)
Patient called wanting a referral to be sent over to the outpatient rehab med center in [place] if she will be cleared to go for PT after her appointment on the 20th. She's going out of town on the 25th and wanted to have at least one appointment before leaving. CB # [phone]

## 2017-06-18 ENCOUNTER — Other Ambulatory Visit (INDEPENDENT_AMBULATORY_CARE_PROVIDER_SITE_OTHER): Payer: Self-pay

## 2017-06-18 DIAGNOSIS — S42295A Other nondisplaced fracture of upper end of left humerus, initial encounter for closed fracture: Secondary | ICD-10-CM

## 2017-06-18 NOTE — Telephone Encounter (Signed)
Referral made patient aware.

## 2017-07-02 ENCOUNTER — Ambulatory Visit (INDEPENDENT_AMBULATORY_CARE_PROVIDER_SITE_OTHER): Payer: Federal, State, Local not specified - PPO

## 2017-07-02 ENCOUNTER — Encounter (INDEPENDENT_AMBULATORY_CARE_PROVIDER_SITE_OTHER): Payer: Self-pay | Admitting: Orthopaedic Surgery

## 2017-07-02 ENCOUNTER — Ambulatory Visit (INDEPENDENT_AMBULATORY_CARE_PROVIDER_SITE_OTHER): Payer: Federal, State, Local not specified - PPO | Admitting: Orthopaedic Surgery

## 2017-07-02 DIAGNOSIS — S42295A Other nondisplaced fracture of upper end of left humerus, initial encounter for closed fracture: Secondary | ICD-10-CM | POA: Diagnosis not present

## 2017-07-02 NOTE — Progress Notes (Signed)
Post-Op Visit Note   Patient: Danielle Arnold           Date of Birth: 01-09-1961           MRN: [id number] Visit Date: 07/02/2017 PCP: Lanice Shirts, MD   Assessment & Plan:  Chief Complaint:  Chief Complaint  Patient presents with  . Left Wrist - Pain, Follow-up  . Left Shoulder - Pain, Follow-up   Visit Diagnoses:  1. Other closed nondisplaced fracture of proximal end of left humerus, initial encounter     Plan: Patient is 6 weeks status post nondisplaced proximal fracture.  She is overall feeling better.  She endorses stiffness.  She is not taking any medicines.  Physical exam is unremarkable.  At this point we will advance her to weight-bear as tolerated and initiate physical therapy.  Recheck in 6 weeks with AP and scapular Y x-rays of the left shoulder.  Follow-Up Instructions: Return in about 6 weeks (around 08/13/2017).   Orders:  Orders Placed This Encounter  Procedures  . XR Shoulder Left   No orders of the defined types were placed in this encounter.   Imaging: Xr Shoulder Left  Result Date: 07/02/2017 Significant improvement and healing of proximal humerus fracture.  Alignment unchanged.   PMFS History: Patient Active Problem List   Diagnosis Date Noted  . Lateral epicondylitis, left elbow 02/09/2017  . Metatarsalgia, left foot 10/09/2016  . Left shoulder pain 10/09/2016  . Right patellofemoral syndrome 12/28/2014  . Osteoarthritis of left hip 08/18/2013  . Hyperlipidemia 12/31/2011  . Tinea 12/25/2011  . Constipation 12/25/2011  . Vaginal dryness 12/25/2011  . Left lumbar radiculitis 11/27/2011  . Sciatica 11/18/2011  . Plantar fasciitis 07/06/2011  . Heel spur 07/06/2011  . Left leg injury 02/10/2011  . Dermatitis 02/10/2011  . Anemia 11/21/2010  . Irritable bowel 11/21/2010  . FH: breast cancer in first degree relative 10/31/2010  . Anxiety   . Menopausal hot flushes    Past Medical History:  Diagnosis Date  . Abnormal Pap  smear and cervical HPV (human papillomavirus) 1989   Cone biopsy  ????dysplasia  . Anxiety    mild on no meds  . Endometriosis   . Fatty liver   . Fibroid   . Infection    UTI  . Liver hemangioma   . Menopausal hot flushes   . Pure hypercholesterolemia   . Vitamin D insufficiency     Family History  Problem Relation Age of Onset  . Dementia Mother   . Cancer Sister        breast  premenopausal  . Cancer Maternal Grandmother        breast  . Sudden death Neg Hx   . Hypertension Neg Hx   . Heart attack Neg Hx   . Hyperlipidemia Neg Hx     Past Surgical History:  Procedure Laterality Date  . APPENDECTOMY  80  . CERVICAL CONE BIOPSY  89  . DILATION AND CURETTAGE OF UTERUS  3/11  . ear operation Bilateral 89 67 68   ear drum repair  . MYOMECTOMY  2000  . UTERINE FIBROID EMBOLIZATION  2003   Social History   Occupational History  . Not on file  Tobacco Use  . Smoking status: Never Smoker  . Smokeless tobacco: Never Used  Substance and Sexual Activity  . Alcohol use: Yes    Alcohol/week: 0.6 oz    Types: 1 Glasses of wine per week  . Drug use: No  .  Sexual activity: Never    Partners: Male

## 2017-07-05 DIAGNOSIS — R42 Dizziness and giddiness: Secondary | ICD-10-CM | POA: Diagnosis not present

## 2017-07-05 DIAGNOSIS — H6982 Other specified disorders of Eustachian tube, left ear: Secondary | ICD-10-CM | POA: Diagnosis not present

## 2017-07-06 ENCOUNTER — Encounter: Payer: Self-pay | Admitting: Physical Therapy

## 2017-07-06 ENCOUNTER — Other Ambulatory Visit: Payer: Self-pay

## 2017-07-06 ENCOUNTER — Ambulatory Visit: Payer: Federal, State, Local not specified - PPO | Attending: Orthopaedic Surgery | Admitting: Physical Therapy

## 2017-07-06 DIAGNOSIS — M25532 Pain in left wrist: Secondary | ICD-10-CM | POA: Diagnosis not present

## 2017-07-06 DIAGNOSIS — M79622 Pain in left upper arm: Secondary | ICD-10-CM

## 2017-07-06 DIAGNOSIS — M25612 Stiffness of left shoulder, not elsewhere classified: Secondary | ICD-10-CM

## 2017-07-06 DIAGNOSIS — M25632 Stiffness of left wrist, not elsewhere classified: Secondary | ICD-10-CM | POA: Diagnosis not present

## 2017-07-06 DIAGNOSIS — M6281 Muscle weakness (generalized): Secondary | ICD-10-CM | POA: Diagnosis not present

## 2017-07-06 NOTE — Therapy (Signed)
[facility] [address]  [place], [place], [address] Phone: [phone]   Fax:  [phone]  Physical Therapy Evaluation  Patient Details  Name: Danielle Arnold MRN: [id number] Date of Birth: Nov 07, 1960 Referring Provider: Frankey Shown, MD   Encounter Date: 07/06/2017  PT End of Session - 07/06/17 0947    Visit Number  1    Number of Visits  17    Date for PT Re-Evaluation  08/31/17    Authorization Type  Federal [employer]; VL 50    PT Start Time  0846    PT Stop Time  0939    PT Time Calculation (min)  53 min    Activity Tolerance  Patient limited by pain;Patient tolerated treatment well    Behavior During Therapy  [facility] for tasks assessed/performed       Past Medical History:  Diagnosis Date  . Abnormal Pap smear and cervical HPV (human papillomavirus) 1989   Cone biopsy  ????dysplasia  . Anxiety    mild on no meds  . Endometriosis   . Fatty liver   . Fibroid   . Infection    UTI  . Liver hemangioma   . Menopausal hot flushes   . Pure hypercholesterolemia   . Vitamin D insufficiency     Past Surgical History:  Procedure Laterality Date  . APPENDECTOMY  80  . CERVICAL CONE BIOPSY  89  . DILATION AND CURETTAGE OF UTERUS  3/11  . ear operation Bilateral 89 67 68   ear drum repair  . MYOMECTOMY  2000  . UTERINE FIBROID EMBOLIZATION  2003    There were no vitals filed for this visit.   Subjective Assessment - 07/06/17 0849    Subjective  Patient reports ~7 weeks ago she fractured her L proximal humerus. Was put in a sling- wore it for 2 weeks. Also injured L wrist- [place]; X-rays negative for wrist. Current complaints: doesn't have full ROM in L wrist and shoulder, L hand stiff in the AM, difficulty with overhead reaching, opening jars. Pain in diffusely over L wrist and centered around L anterior shoulder when performing ROM.  Patient reports she has been cleared to use her L arm but has not tried to  perform any activities with it at this time.     Limitations  Lifting;House hold activities    Diagnostic tests  L shoulder xray 07/02/17: Significant improvement and healing of proximal humerus fracture. Alignment unchanged. L wrist xray 06/12/17: Generalized osteopenia.  No acute abnormalities.  Mild osteoarthritis.    Patient Stated Goals  get ROM back, have no pain    Currently in Pain?  Yes    Pain Score  6     Pain Location  Wrist    Pain Orientation  Left    Pain Descriptors / Indicators  Tingling;Tightness;Shooting    Pain Type  Acute pain    Aggravating Factors   in the AM, bending, gripping    Pain Relieving Factors  heat, ibuprofen     Multiple Pain Sites  Yes    Pain Score  7    Pain Location  Shoulder    Pain Orientation  Left    Pain Descriptors / Indicators  Tightness    Pain Type  Acute pain    Aggravating Factors   raising over, L sidelying     Pain Relieving Factors  heat, ibuprofen          [facility]  PT Assessment - 07/06/17 0906      Assessment   Medical Diagnosis  Other closed nondisplaced fx of proximal end of L humerus    Referring Provider  Frankey Shown, MD    Onset Date/Surgical Date  05/19/17    Hand Dominance  Left    Next MD Visit  08/13/17    Prior Therapy  Yes- back previous R humerus fx      Precautions   Precautions  None      Restrictions   Weight Bearing Restrictions  No      Balance Screen   Has the patient fallen in the past 6 months  No    Has the patient had a decrease in activity level because of a fear of falling?   No    Is the patient reluctant to leave their home because of a fear of falling?   No      Home Environment   Living Environment  Private residence    Living Arrangements  Alone    Available Help at Discharge  Family;Friend(s);Neighbor    Type of [place] Access  Level entry    [place]  None      Prior Function   Level of [place]  Retired     Leisure  hiking, biking      Cognition   Overall Cognitive Status  Within Functional Limits for tasks assessed      Observation/Other Assessments   Focus on Therapeutic Outcomes (FOTO)   Upper arm: 57 (43% limited, 29% predicted)      Observation/Other Assessments-Edema    Edema  -- L arm with marked edema in mid humerus      Sensation   Light Touch  Appears Intact c/o intermittent tingling in L 2&3 digits      Coordination   Gross Motor Movements are Fluid and Coordinated  Yes      Posture/Postural Control   Posture/Postural Control  Postural limitations    Postural Limitations  Rounded Shoulders;Forward head;Increased thoracic kyphosis      ROM / Strength   AROM / PROM / Strength  AROM;PROM;Strength      AROM   AROM Assessment Site  Shoulder;Elbow;Wrist    Right/Left Shoulder  Left R shoulder WFL    Left Shoulder Flexion  50 Degrees    Left Shoulder ABduction  50 Degrees    Left Shoulder Internal Rotation  70 Degrees    Left Shoulder External Rotation  42 Degrees    Right/Left Wrist  Left    Left Wrist Extension  45 Degrees    Left Wrist Flexion  60 Degrees      PROM   PROM Assessment Site  Shoulder;Elbow;Wrist    Right/Left Shoulder  Left    Left Shoulder Flexion  112 Degrees    Left Shoulder ABduction  85 Degrees    Left Shoulder Internal Rotation  87 Degrees    Left Shoulder External Rotation  82 Degrees    Right/Left Wrist  Left    Left Wrist Extension  65 Degrees    Left Wrist Flexion  60 Degrees      Strength   Strength Assessment Site  Shoulder;Elbow;Wrist    Right/Left Shoulder  Right;Left    Right Shoulder Flexion  4+/5    Right Shoulder ABduction  4+/5    Right Shoulder Internal Rotation  4/5  Right Shoulder External Rotation  4/5    Left Shoulder Flexion  3+/5 pain ant shoulder    Left Shoulder ABduction  2+/5 pain ant shoulder    Left Shoulder Internal Rotation  2+/5    Left Shoulder External Rotation  2+/5    Right/Left Elbow  Right;Left     Right Elbow Flexion  4+/5    Right Elbow Extension  4+/5    Left Elbow Flexion  3+/5    Left Elbow Extension  3+/5    Right/Left Wrist  Right;Left    Right Wrist Flexion  4+/5    Right Wrist Extension  4+/5    Left Wrist Flexion  3+/5 pain    Left Wrist Extension  3+/5 pain      Palpation   Palpation comment  edematous and bruising in L mid humerus, pain with motion in L ant shoulder                 Objective measurements completed on examination: See above findings.              PT Education - 07/06/17 0947    Education Details  prognosis, POC, HEP    Person(s) Educated  Patient    Methods  Explanation;Demonstration;Tactile cues;Verbal cues;Handout    Comprehension  Returned demonstration;Verbalized understanding       PT Short Term Goals - 07/06/17 0953      PT SHORT TERM GOAL #1   Title  Patient to be independent with initial HEP.    Time  4    Period  Weeks    Status  New    Target Date  08/03/17        PT Long Term Goals - 07/06/17 0954      PT LONG TERM GOAL #1   Title  Patient to be independent with advanced HEP.    Time  8    Period  Weeks    Status  New    Target Date  08/31/17      PT LONG TERM GOAL #2   Title  Patient to demonstrate L shoulder AROM/PROM [facility] without pain limiting.     Time  8    Period  Weeks    Status  New    Target Date  08/31/17      PT LONG TERM GOAL #3   Title  Patient to demonstrate [facility] L wrist AROM/PROM without pain limiting.      PT LONG TERM GOAL #4   Title  Patient to demonstrate >=4+/5 L UE strength.     Time  8    Period  Weeks    Status  New    Target Date  08/31/17      PT LONG TERM GOAL #5   Title  Patient to demonstrate overhead reaching with L UE to retrieve 5lb object from cabinet.     Time  8    Period  Weeks    Status  New    Target Date  08/31/17      Additional Long Term Goals   Additional Long Term Goals  Yes      PT LONG TERM GOAL #6   Title  Patient to report comfortable  use of walking sticks in B UEs when hiking to return to recreational hiking hobby.     Time  8    Period  Weeks    Status  New    Target Date  08/31/17  Plan - 07/06/17 0948    Clinical Impression Statement  Patient is a 56y/o F presenting to OPPT after sustaining a L humeral neck fx and [place] injury to wrist on 05/19/17. Patient has now been cleared for WBing and AROM of L UE. Current complaints include decreased ROM in L shoulder and wrist, L hand stiffness in AM, and difficulty with overhead reaching and opening jars. Patient today with decreased L wrist and shoulder AROM/PROM, marked weakness in L shoulder, elbow, wrist, L anterior shoulder pain with ROM and edema in L mid humerus. Patient educated on pendulum exercises and gentle wrist stretching; reported understanding. Patient will benefit from skilled PT services to address functional impairments.    Clinical Presentation  Stable    Clinical Decision Making  Low    Rehab Potential  Good    PT Frequency  2x / week    PT Duration  8 weeks    PT Treatment/Interventions  ADLs/Self Care Home Management;Cryotherapy;Electrical Stimulation;Moist Heat;Ultrasound;Functional mobility training;Therapeutic activities;Therapeutic exercise;Manual techniques;Patient/family education;Passive range of motion;Dry needling;Energy conservation;Splinting;Taping;Vasopneumatic Device    PT Next Visit Plan  reassess HEP, AAROM shoulder     Consulted and Agree with Plan of Care  Patient       Patient will benefit from skilled therapeutic intervention in order to improve the following deficits and impairments:  Hypomobility, Decreased activity tolerance, Decreased strength, Impaired UE functional use, Pain, Decreased mobility, Decreased range of motion, Postural dysfunction, Impaired flexibility  Visit Diagnosis: Pain in left upper arm  Stiffness of left shoulder, not elsewhere classified  Pain in left wrist  Stiffness of left wrist, not  elsewhere classified  Muscle weakness (generalized)     Problem List Patient Active Problem List   Diagnosis Date Noted  . Lateral epicondylitis, left elbow 02/09/2017  . Metatarsalgia, left foot 10/09/2016  . Left shoulder pain 10/09/2016  . Right patellofemoral syndrome 12/28/2014  . Osteoarthritis of left hip 08/18/2013  . Hyperlipidemia 12/31/2011  . Tinea 12/25/2011  . Constipation 12/25/2011  . Vaginal dryness 12/25/2011  . Left lumbar radiculitis 11/27/2011  . Sciatica 11/18/2011  . Plantar fasciitis 07/06/2011  . Heel spur 07/06/2011  . Left leg injury 02/10/2011  . Dermatitis 02/10/2011  . Anemia 11/21/2010  . Irritable bowel 11/21/2010  . FH: breast cancer in first degree relative 10/31/2010  . Anxiety   . Menopausal hot flushes      Janene Harvey, [place], DPT 07/06/17 11:49 AM   [facility] [address]  [place], [place], [address] Phone: [phone]   Fax:  [phone]  Name: GERYL DOHN MRN: [id number] Date of Birth: [birth year]

## 2017-07-15 ENCOUNTER — Ambulatory Visit: Payer: Federal, State, Local not specified - PPO | Attending: Orthopaedic Surgery

## 2017-07-15 DIAGNOSIS — M25632 Stiffness of left wrist, not elsewhere classified: Secondary | ICD-10-CM

## 2017-07-15 DIAGNOSIS — M25612 Stiffness of left shoulder, not elsewhere classified: Secondary | ICD-10-CM | POA: Diagnosis not present

## 2017-07-15 DIAGNOSIS — M79622 Pain in left upper arm: Secondary | ICD-10-CM | POA: Diagnosis not present

## 2017-07-15 DIAGNOSIS — M6281 Muscle weakness (generalized): Secondary | ICD-10-CM | POA: Diagnosis not present

## 2017-07-15 DIAGNOSIS — M25532 Pain in left wrist: Secondary | ICD-10-CM

## 2017-07-15 NOTE — Therapy (Signed)
[facility] [address]  [place], [place], [address] Phone: [phone]   Fax:  [phone]  Physical Therapy Treatment  Patient Details  Name: Danielle Arnold MRN: [id number] Date of Birth: [birth year] Referring Provider: Frankey Shown, MD   Encounter Date: 07/15/2017  PT End of Session - 07/15/17 0849    Visit Number  2    Number of Visits  17    Date for PT Re-Evaluation  08/31/17    Authorization Type  Federal [employer]; VL 50    PT Start Time  0846    PT Stop Time  0930    PT Time Calculation (min)  44 min    Activity Tolerance  Patient limited by pain;Patient tolerated treatment well    Behavior During Therapy  [facility] for tasks assessed/performed       Past Medical History:  Diagnosis Date  . Abnormal Pap smear and cervical HPV (human papillomavirus) 1989   Cone biopsy  ????dysplasia  . Anxiety    mild on no meds  . Endometriosis   . Fatty liver   . Fibroid   . Infection    UTI  . Liver hemangioma   . Menopausal hot flushes   . Pure hypercholesterolemia   . Vitamin D insufficiency     Past Surgical History:  Procedure Laterality Date  . APPENDECTOMY  80  . CERVICAL CONE BIOPSY  89  . DILATION AND CURETTAGE OF UTERUS  3/11  . ear operation Bilateral 89 67 68   ear drum repair  . MYOMECTOMY  2000  . UTERINE FIBROID EMBOLIZATION  2003    There were no vitals filed for this visit.  Subjective Assessment - 07/15/17 0848    Subjective  Pt. noting no issues with HEP.  Feels her ROM has improved some since performing HEP.  Waking with pain at night.      Diagnostic tests  L shoulder xray 07/02/17: Significant improvement and healing of proximal humerus fracture. Alignment unchanged. L wrist xray 06/12/17: Generalized osteopenia.  No acute abnormalities.  Mild osteoarthritis.    Patient Stated Goals  get ROM back, have no pain    Currently in Pain?  Yes    Pain Score  6     Pain Orientation  Left    Pain Descriptors / Indicators  Constant;Aching;Dull    Pain Type  Acute pain    Aggravating Factors   nothing     Pain Relieving Factors  heat, ibuprofen     Multiple Pain Sites  No                       [facility] Adult PT Treatment/Exercise - 07/15/17 0916      Shoulder Exercises: Supine   Protraction  Left;10 reps    Protraction Limitations  Some cueing for proper motion     External Rotation  AAROM;Left;10 reps    External Rotation Limitations  wand; pillow under elbow    Flexion  Left;AAROM;10 reps    Flexion Limitations  wand     ABduction  Left;AAROM;10 reps    ABduction Limitations  wand; pillow under UE    Other Supine Exercises  Supine shoulder circles CW, CCW x 10 reps       Shoulder Exercises: Standing   Other Standing Exercises  L shoulder pendulums horizontal, forward/back, circles x 30 sec each       Shoulder Exercises: Pulleys   Flexion  3 minutes    Scaption  3 minutes      Manual Therapy   Manual Therapy  Passive ROM;Joint mobilization    Manual therapy comments  supine     Joint Mobilization  L shoulder A/P, inferior mobs for improved ROM     Passive ROM  L shoulder PROM all directions to tolerance              PT Education - 07/15/17 1049    Education Details  HEP update     Person(s) Educated  Patient    Methods  Explanation;Demonstration;Verbal cues;Handout    Comprehension  Verbalized understanding;Returned demonstration;Verbal cues required;Need further instruction       PT Short Term Goals - 07/15/17 0850      PT SHORT TERM GOAL #1   Title  Patient to be independent with initial HEP.    Time  4    Period  Weeks    Status  On-going        PT Long Term Goals - 07/15/17 0851      PT LONG TERM GOAL #1   Title  Patient to be independent with advanced HEP.    Time  8    Period  Weeks    Status  On-going      PT LONG TERM GOAL #2   Title  Patient to demonstrate L shoulder AROM/PROM WFL without pain limiting.     Time   8    Period  Weeks    Status  On-going      PT LONG TERM GOAL #3   Title  Patient to demonstrate WFL L wrist AROM/PROM without pain limiting.    Status  On-going      PT LONG TERM GOAL #4   Title  Patient to demonstrate >=4+/5 L UE strength.     Time  8    Period  Weeks    Status  On-going      PT LONG TERM GOAL #5   Title  Patient to demonstrate overhead reaching with L UE to retrieve 5lb object from cabinet.     Time  8    Period  Weeks    Status  On-going      PT LONG TERM GOAL #6   Title  Patient to report comfortable use of walking sticks in B UEs when hiking to return to recreational hiking hobby.     Time  8    Period  Weeks    Status  New            Plan - 07/15/17 9326    Clinical Impression Statement  Danielle Arnold noting some improvement in L shoulder ROM since performing HEP.  Tolerated all AAROM and manual joint mobs/PROM well in session.  Did require some cueing for proper technique with AAROM wand activities.  HEP updated.  Will monitor response at upcoming visit.    PT Treatment/Interventions  ADLs/Self Care Home Management;Cryotherapy;Electrical Stimulation;Moist Heat;Ultrasound;Functional mobility training;Therapeutic activities;Therapeutic exercise;Manual techniques;Patient/family education;Passive range of motion;Dry needling;Energy conservation;Splinting;Taping;Vasopneumatic Device    Consulted and Agree with Plan of Care  Patient       Patient will benefit from skilled therapeutic intervention in order to improve the following deficits and impairments:  Hypomobility, Decreased activity tolerance, Decreased strength, Impaired UE functional use, Pain, Decreased mobility, Decreased range of motion, Postural dysfunction, Impaired flexibility  Visit Diagnosis: Pain in left upper arm  Stiffness of left shoulder, not elsewhere classified  Pain in left wrist  Stiffness of left wrist, not elsewhere classified     Problem List Patient Active Problem List    Diagnosis Date Noted  . Lateral epicondylitis, left elbow 02/09/2017  . Metatarsalgia, left foot 10/09/2016  . Left shoulder pain 10/09/2016  . Right patellofemoral syndrome 12/28/2014  . Osteoarthritis of left hip 08/18/2013  . Hyperlipidemia 12/31/2011  . Tinea 12/25/2011  . Constipation 12/25/2011  . Vaginal dryness 12/25/2011  . Left lumbar radiculitis 11/27/2011  . Sciatica 11/18/2011  . Plantar fasciitis 07/06/2011  . Heel spur 07/06/2011  . Left leg injury 02/10/2011  . Dermatitis 02/10/2011  . Anemia 11/21/2010  . Irritable bowel 11/21/2010  . FH: breast cancer in first degree relative 10/31/2010  . Anxiety   . Menopausal hot flushes     Bess Harvest, PTA 07/15/17 10:50 AM    [facility] [address]  [place], [place], [address] Phone: [phone]   Fax:  [phone]  Name: Danielle Arnold MRN: [id number] Date of Birth: 28-Oct-1960

## 2017-07-17 ENCOUNTER — Ambulatory Visit: Payer: Federal, State, Local not specified - PPO

## 2017-07-17 DIAGNOSIS — M79622 Pain in left upper arm: Secondary | ICD-10-CM

## 2017-07-17 DIAGNOSIS — M25612 Stiffness of left shoulder, not elsewhere classified: Secondary | ICD-10-CM | POA: Diagnosis not present

## 2017-07-17 DIAGNOSIS — M25632 Stiffness of left wrist, not elsewhere classified: Secondary | ICD-10-CM | POA: Diagnosis not present

## 2017-07-17 DIAGNOSIS — M25532 Pain in left wrist: Secondary | ICD-10-CM

## 2017-07-17 DIAGNOSIS — M6281 Muscle weakness (generalized): Secondary | ICD-10-CM | POA: Diagnosis not present

## 2017-07-17 NOTE — Therapy (Addendum)
[facility] [address]  [place], [place], [address] Phone: [phone]   Fax:  [phone]  Physical Therapy Treatment  Patient Details  Name: JESUSA STENERSON MRN: [id number] Date of Birth: 1960-06-16 Referring Provider: Frankey Shown, MD   Encounter Date: 07/17/2017  PT End of Session - 07/17/17 0936    Visit Number  3    Number of Visits  17    Date for PT Re-Evaluation  08/31/17    Authorization Type  Federal [employer]; VL 50    PT Start Time  8270    PT Stop Time  1026 ended session with 10 min moist heat    PT Time Calculation (min)  55 min    Activity Tolerance  Patient limited by pain;Patient tolerated treatment well    Behavior During Therapy  [facility] for tasks assessed/performed       Past Medical History:  Diagnosis Date  . Abnormal Pap smear and cervical HPV (human papillomavirus) 1989   Cone biopsy  ????dysplasia  . Anxiety    mild on no meds  . Endometriosis   . Fatty liver   . Fibroid   . Infection    UTI  . Liver hemangioma   . Menopausal hot flushes   . Pure hypercholesterolemia   . Vitamin D insufficiency     Past Surgical History:  Procedure Laterality Date  . APPENDECTOMY  80  . CERVICAL CONE BIOPSY  89  . DILATION AND CURETTAGE OF UTERUS  3/11  . ear operation Bilateral 89 67 68   ear drum repair  . MYOMECTOMY  2000  . UTERINE FIBROID EMBOLIZATION  2003    There were no vitals filed for this visit.  Subjective Assessment - 07/17/17 0935    Subjective  Notes no issues with updated HEP.      Diagnostic tests  L shoulder xray 07/02/17: Significant improvement and healing of proximal humerus fracture. Alignment unchanged. L wrist xray 06/12/17: Generalized osteopenia.  No acute abnormalities.  Mild osteoarthritis.    Patient Stated Goals  get ROM back, have no pain    Currently in Pain?  Yes    Pain Score  6     Pain Location  Wrist    Pain Orientation  Left    Pain Descriptors /  Indicators  Constant;Aching;Dull    Pain Type  Acute pain    Aggravating Factors   squeezing activities     Pain Relieving Factors  heat     Multiple Pain Sites  No                       [facility] Adult PT Treatment/Exercise - 07/17/17 0947      Shoulder Exercises: Supine   Protraction  Left;15 reps    Protraction Limitations  with green med ball (500Gr)    Flexion  Left;AROM;10 reps    Flexion Limitations  some pain on eccentric return     Other Supine Exercises  Supine shoulder circles CW, CCW x 15 reps     Other Supine Exercises  Supine IR/ER, 90 dg sustained flexion rhythmic stabilization x 30 sec each way with therapist perturbations       Shoulder Exercises: Standing   External Rotation  10 reps;Left;Theraband    Theraband Level (Shoulder External Rotation)  Level 1 (Yellow)    External Rotation Limitations  isometric band step-out    Internal Rotation  10 reps;Left;Theraband  Theraband Level (Shoulder Internal Rotation)  Level 1 (Yellow)    Internal Rotation Limitations  isometric band step-outs     Other Standing Exercises  L IR AAROM with wand x 10 rpes       Shoulder Exercises: Pulleys   Flexion  3 minutes    Scaption  3 minutes      Shoulder Exercises: Stretch   Internal Rotation Stretch  5 reps with towel       Modalities   Modalities  Moist Heat      Moist Heat Therapy   Number Minutes Moist Heat  10 Minutes    Moist Heat Location  Shoulder L      Manual Therapy   Manual Therapy  Passive ROM;Joint mobilization;Soft tissue mobilization;Myofascial release    Manual therapy comments  supine     Joint Mobilization  L shoulder A/P, inferior mobs for improved ROM     Soft tissue mobilization  STM to L lats, L posterior/inferior shoulder; pt. ttp; STM to L anterior shoulder/proximal biceps; very ttp     Myofascial Release  TPR to L proximal biceps     Passive ROM  L shoulder PROM all directions to tolerance              PT Education -  07/17/17 1028    Education Details  HEP upate     Person(s) Educated  Patient    Methods  Explanation;Demonstration;Verbal cues;Handout    Comprehension  Verbalized understanding;Returned demonstration;Verbal cues required;Need further instruction       PT Short Term Goals - 07/15/17 0850      PT SHORT TERM GOAL #1   Title  Patient to be independent with initial HEP.    Time  4    Period  Weeks    Status  On-going        PT Long Term Goals - 07/15/17 0851      PT LONG TERM GOAL #1   Title  Patient to be independent with advanced HEP.    Time  8    Period  Weeks    Status  On-going      PT LONG TERM GOAL #2   Title  Patient to demonstrate L shoulder AROM/PROM WFL without pain limiting.     Time  8    Period  Weeks    Status  On-going      PT LONG TERM GOAL #3   Title  Patient to demonstrate WFL L wrist AROM/PROM without pain limiting.    Status  On-going      PT LONG TERM GOAL #4   Title  Patient to demonstrate >=4+/5 L UE strength.     Time  8    Period  Weeks    Status  On-going      PT LONG TERM GOAL #5   Title  Patient to demonstrate overhead reaching with L UE to retrieve 5lb object from cabinet.     Time  8    Period  Weeks    Status  On-going      PT LONG TERM GOAL #6   Title  Patient to report comfortable use of walking sticks in B UEs when hiking to return to recreational hiking hobby.     Time  8    Period  Weeks    Status  New            Plan - 07/17/17 1000    Clinical Impression Statement  Pt.  reporting no issues with updated HEP however still with complaint of intermittent L anterior shoulder soreness throughout day.  Pt. with palpable TP in proximal biceps area today and ttp in anterior, posterior/inferior shoulder, which was addressed with STM/TPR with some relief.  Advanced AAROM activities today with addition of yellow TB ER/IR isometric step-outs, which were tolerated well.  Ended session with moist heat to L shoulder as pt. noted  benefit from this in past session.  Will plan to monitor response to today's therex progression and updated home program per pt. response in coming visits.      PT Treatment/Interventions  ADLs/Self Care Home Management;Cryotherapy;Electrical Stimulation;Moist Heat;Ultrasound;Functional mobility training;Therapeutic activities;Therapeutic exercise;Manual techniques;Patient/family education;Passive range of motion;Dry needling;Energy conservation;Splinting;Taping;Vasopneumatic Device    PT Next Visit Plan  reassess updated HEP, AAROM shoulder; continue isometrics at shoulder per tolerance     Consulted and Agree with Plan of Care  Patient       Patient will benefit from skilled therapeutic intervention in order to improve the following deficits and impairments:  Hypomobility, Decreased activity tolerance, Decreased strength, Impaired UE functional use, Pain, Decreased mobility, Decreased range of motion, Postural dysfunction, Impaired flexibility  Visit Diagnosis: Pain in left upper arm  Stiffness of left shoulder, not elsewhere classified  Pain in left wrist  Stiffness of left wrist, not elsewhere classified  Muscle weakness (generalized)     Problem List Patient Active Problem List   Diagnosis Date Noted  . Lateral epicondylitis, left elbow 02/09/2017  . Metatarsalgia, left foot 10/09/2016  . Left shoulder pain 10/09/2016  . Right patellofemoral syndrome 12/28/2014  . Osteoarthritis of left hip 08/18/2013  . Hyperlipidemia 12/31/2011  . Tinea 12/25/2011  . Constipation 12/25/2011  . Vaginal dryness 12/25/2011  . Left lumbar radiculitis 11/27/2011  . Sciatica 11/18/2011  . Plantar fasciitis 07/06/2011  . Heel spur 07/06/2011  . Left leg injury 02/10/2011  . Dermatitis 02/10/2011  . Anemia 11/21/2010  . Irritable bowel 11/21/2010  . FH: breast cancer in first degree relative 10/31/2010  . Anxiety   . Menopausal hot flushes     Bess Harvest, PTA 07/17/17 10:51  AM   [facility] [address]  [place], [place], [address] Phone: [phone]   Fax:  [phone]  Name: BREE HEINZELMAN MRN: [id number] Date of Birth: 1960/07/23

## 2017-07-20 ENCOUNTER — Ambulatory Visit: Payer: Federal, State, Local not specified - PPO

## 2017-07-20 ENCOUNTER — Encounter: Payer: Self-pay | Admitting: Physical Therapy

## 2017-07-20 DIAGNOSIS — M25632 Stiffness of left wrist, not elsewhere classified: Secondary | ICD-10-CM

## 2017-07-20 DIAGNOSIS — M25612 Stiffness of left shoulder, not elsewhere classified: Secondary | ICD-10-CM

## 2017-07-20 DIAGNOSIS — M6281 Muscle weakness (generalized): Secondary | ICD-10-CM | POA: Diagnosis not present

## 2017-07-20 DIAGNOSIS — M25532 Pain in left wrist: Secondary | ICD-10-CM

## 2017-07-20 DIAGNOSIS — M79622 Pain in left upper arm: Secondary | ICD-10-CM

## 2017-07-20 NOTE — Therapy (Signed)
[facility] [address]  [place], [place], [address] Phone: [phone]   Fax:  [phone]  Physical Therapy Treatment  Patient Details  Name: Danielle Arnold MRN: [id number] Date of Birth: 01-22-1960 Referring Provider: Frankey Shown, MD   Encounter Date: 07/20/2017  PT End of Session - 07/20/17 0851    Visit Number  4    Number of Visits  17    Date for PT Re-Evaluation  08/31/17    Authorization Type  Federal [employer]; VL 50    PT Start Time  0846    PT Stop Time  5329 ended session with 10 min moist heat    PT Time Calculation (min)  52 min    Activity Tolerance  Patient tolerated treatment well    Behavior During Therapy  [facility] for tasks assessed/performed       Past Medical History:  Diagnosis Date  . Abnormal Pap smear and cervical HPV (human papillomavirus) 1989   Cone biopsy  ????dysplasia  . Anxiety    mild on no meds  . Endometriosis   . Fatty liver   . Fibroid   . Infection    UTI  . Liver hemangioma   . Menopausal hot flushes   . Pure hypercholesterolemia   . Vitamin D insufficiency     Past Surgical History:  Procedure Laterality Date  . APPENDECTOMY  80  . CERVICAL CONE BIOPSY  89  . DILATION AND CURETTAGE OF UTERUS  3/11  . ear operation Bilateral 89 67 68   ear drum repair  . MYOMECTOMY  2000  . UTERINE FIBROID EMBOLIZATION  2003    There were no vitals filed for this visit.  Subjective Assessment - 07/20/17 0846    Subjective  Reports it seems like she is getting further along. Has pulleys at home.     Diagnostic tests  L shoulder xray 07/02/17: Significant improvement and healing of proximal humerus fracture. Alignment unchanged. L wrist xray 06/12/17: Generalized osteopenia.  No acute abnormalities.  Mild osteoarthritis.    Patient Stated Goals  get ROM back, have no pain    Currently in Pain?  Yes    Pain Score  5     Pain Location  Wrist    Pain Orientation  Left    Pain  Descriptors / Indicators  Aching    Pain Type  Acute pain    Multiple Pain Sites  Yes    Pain Score  5    Pain Location  Arm    Pain Orientation  Left    Pain Descriptors / Indicators  Aching    Pain Type  Acute pain                       [facility] Adult PT Treatment/Exercise - 07/20/17 0908      Self-Care   Self-Care  Other Self-Care Comments    Other Self-Care Comments   Review of self TPR with thera cane in seated to L UT tenderness x 30 sec       Shoulder Exercises: Standing   External Rotation  10 reps;Left;Theraband    Theraband Level (Shoulder External Rotation)  Level 1 (Yellow)    External Rotation Limitations  towel under elbow    Internal Rotation  10 reps;Left;Theraband    Theraband Level (Shoulder Internal Rotation)  Level 1 (Yellow)    Internal Rotation Limitations  towel under elbow  Extension  Both;10 reps;Theraband;Strengthening    Theraband Level (Shoulder Extension)  Level 1 (Yellow)    Extension Limitations  Cues for proper motion     Row  Both;15 reps;Theraband;Strengthening    Theraband Level (Shoulder Row)  Level 1 (Yellow)    Row Limitations  Cues for full scapular retraction       Shoulder Exercises: Pulleys   Flexion  3 minutes    Scaption  3 minutes      Shoulder Exercises: Isometric Strengthening   Flexion  -- 5" x 10 reps     External Rotation  -- 5" x 10 reps     Internal Rotation  -- 5" x 10 reps     ABduction  -- 5" x 10 reps       Shoulder Exercises: Stretch   Other Shoulder Stretches  L UT stretch x 30 sec       Moist Heat Therapy   Number Minutes Moist Heat  10 Minutes    Moist Heat Location  Shoulder L      Manual Therapy   Manual Therapy  Passive ROM;Joint mobilization;Soft tissue mobilization;Myofascial release    Manual therapy comments  supine     Joint Mobilization  L shoulder A/P, inferior mobs for improved ROM     Soft tissue mobilization  STM to L lats, L UT, L proximal biceps/anterior shoulder L  posterior/inferior shoulder; ttp in L proximal biceps and L UT    Myofascial Release  TPR to L UT; some relief noted following this     Passive ROM  L shoulder PROM with gentle stretch all directions to tolerance              PT Education - 07/20/17 1045    Education Details  HEP update     Person(s) Educated  Patient    Methods  Explanation;Demonstration;Verbal cues;Handout    Comprehension  Verbalized understanding;Returned demonstration;Verbal cues required;Need further instruction       PT Short Term Goals - 07/15/17 0850      PT SHORT TERM GOAL #1   Title  Patient to be independent with initial HEP.    Time  4    Period  Weeks    Status  On-going        PT Long Term Goals - 07/15/17 0851      PT LONG TERM GOAL #1   Title  Patient to be independent with advanced HEP.    Time  8    Period  Weeks    Status  On-going      PT LONG TERM GOAL #2   Title  Patient to demonstrate L shoulder AROM/PROM WFL without pain limiting.     Time  8    Period  Weeks    Status  On-going      PT LONG TERM GOAL #3   Title  Patient to demonstrate WFL L wrist AROM/PROM without pain limiting.    Status  On-going      PT LONG TERM GOAL #4   Title  Patient to demonstrate >=4+/5 L UE strength.     Time  8    Period  Weeks    Status  On-going      PT LONG TERM GOAL #5   Title  Patient to demonstrate overhead reaching with L UE to retrieve 5lb object from cabinet.     Time  8    Period  Weeks    Status  On-going  PT LONG TERM GOAL #6   Title  Patient to report comfortable use of walking sticks in B UEs when hiking to return to recreational hiking hobby.     Time  8    Period  Weeks    Status  New            Plan - 07/20/17 9450    Clinical Impression Statement  Pt. noting she felt fine after last visit.  Added L shoulder isometrics and advanced yellow TB resisted IR/ER today without issue.  Pt. did require some vc's for proper motion and scapular retraction today  to ensure proper muscular activation.  HEP updated to include band row and shoulder isometrics.  Will monitor response at upcoming visit.    PT Treatment/Interventions  ADLs/Self Care Home Management;Cryotherapy;Electrical Stimulation;Moist Heat;Ultrasound;Functional mobility training;Therapeutic activities;Therapeutic exercise;Manual techniques;Patient/family education;Passive range of motion;Dry needling;Energy conservation;Splinting;Taping;Vasopneumatic Device    Consulted and Agree with Plan of Care  Patient       Patient will benefit from skilled therapeutic intervention in order to improve the following deficits and impairments:  Hypomobility, Decreased activity tolerance, Decreased strength, Impaired UE functional use, Pain, Decreased mobility, Decreased range of motion, Postural dysfunction, Impaired flexibility  Visit Diagnosis: Pain in left upper arm  Stiffness of left shoulder, not elsewhere classified  Pain in left wrist  Stiffness of left wrist, not elsewhere classified  Muscle weakness (generalized)     Problem List Patient Active Problem List   Diagnosis Date Noted  . Lateral epicondylitis, left elbow 02/09/2017  . Metatarsalgia, left foot 10/09/2016  . Left shoulder pain 10/09/2016  . Right patellofemoral syndrome 12/28/2014  . Osteoarthritis of left hip 08/18/2013  . Hyperlipidemia 12/31/2011  . Tinea 12/25/2011  . Constipation 12/25/2011  . Vaginal dryness 12/25/2011  . Left lumbar radiculitis 11/27/2011  . Sciatica 11/18/2011  . Plantar fasciitis 07/06/2011  . Heel spur 07/06/2011  . Left leg injury 02/10/2011  . Dermatitis 02/10/2011  . Anemia 11/21/2010  . Irritable bowel 11/21/2010  . FH: breast cancer in first degree relative 10/31/2010  . Anxiety   . Menopausal hot flushes     Bess Harvest, PTA 07/20/17 10:52 AM   [facility] [address]  [place], [place], [address] Phone:  [phone]   Fax:  [phone]  Name: LAKEESHA FONTANILLA MRN: [id number] Date of Birth: 01/11/1961

## 2017-07-23 ENCOUNTER — Encounter: Payer: Self-pay | Admitting: Physical Therapy

## 2017-07-23 ENCOUNTER — Ambulatory Visit: Payer: Federal, State, Local not specified - PPO | Admitting: Physical Therapy

## 2017-07-23 DIAGNOSIS — M6281 Muscle weakness (generalized): Secondary | ICD-10-CM | POA: Diagnosis not present

## 2017-07-23 DIAGNOSIS — M79622 Pain in left upper arm: Secondary | ICD-10-CM

## 2017-07-23 DIAGNOSIS — M25532 Pain in left wrist: Secondary | ICD-10-CM | POA: Diagnosis not present

## 2017-07-23 DIAGNOSIS — M25612 Stiffness of left shoulder, not elsewhere classified: Secondary | ICD-10-CM | POA: Diagnosis not present

## 2017-07-23 DIAGNOSIS — M25632 Stiffness of left wrist, not elsewhere classified: Secondary | ICD-10-CM

## 2017-07-23 NOTE — Therapy (Signed)
[facility] [address]  [place], [place], [address] Phone: [phone]   Fax:  [phone]  Physical Therapy Treatment  Patient Details  Name: Danielle Arnold MRN: [id number] Date of Birth: 12-28-1960 Referring Provider: Frankey Shown, MD   Encounter Date: 07/23/2017  PT End of Session - 07/23/17 1116    Visit Number  5    Number of Visits  17    Date for PT Re-Evaluation  08/31/17    Authorization Type  Federal BC[employer]; VL 50    PT Start Time  0845    PT Stop Time  0938 moist heat    PT Time Calculation (min)  53 min    Activity Tolerance  Patient tolerated treatment well    Behavior During Therapy  [facility] for tasks assessed/performed       Past Medical History:  Diagnosis Date  . Abnormal Pap smear and cervical HPV (human papillomavirus) 1989   Cone biopsy  ????dysplasia  . Anxiety    mild on no meds  . Endometriosis   . Fatty liver   . Fibroid   . Infection    UTI  . Liver hemangioma   . Menopausal hot flushes   . Pure hypercholesterolemia   . Vitamin D insufficiency     Past Surgical History:  Procedure Laterality Date  . APPENDECTOMY  80  . CERVICAL CONE BIOPSY  89  . DILATION AND CURETTAGE OF UTERUS  3/11  . ear operation Bilateral 89 67 68   ear drum repair  . MYOMECTOMY  2000  . UTERINE FIBROID EMBOLIZATION  2003    There were no vitals filed for this visit.  Subjective Assessment - 07/23/17 0846    Subjective  Patient reports things are not too bad. Wishes she was progressing faster.     Diagnostic tests  L shoulder xray 07/02/17: Significant improvement and healing of proximal humerus fracture. Alignment unchanged. L wrist xray 06/12/17: Generalized osteopenia.  No acute abnormalities.  Mild osteoarthritis.    Patient Stated Goals  get ROM back, have no pain    Currently in Pain?  Yes    Pain Score  6     Pain Location  Wrist    Pain Orientation  Left    Pain Descriptors / Indicators   Aching    Pain Type  Acute pain    Multiple Pain Sites  Yes    Pain Score  5    Pain Location  Arm    Pain Orientation  Left    Pain Descriptors / Indicators  Aching    Pain Type  Acute pain                       [facility] Adult PT Treatment/Exercise - 07/23/17 0001      Exercises   Exercises  Wrist L wrist flexion/extension and radial/ulnar deviation 10x eac      Shoulder Exercises: Supine   Protraction  Both;15 reps;AAROM;Limitations    Protraction Limitations  wand; cues for scap retraction at the bottom    External Rotation  AAROM;Left;10 reps    External Rotation Limitations  wand; cues to keep elbows by sides    Flexion  Left;AROM;10 reps    Flexion Limitations  wand; good tolerance    ABduction  Left;AAROM;10 reps    ABduction Limitations  wand; pillow under UE      Shoulder Exercises: Seated   Retraction  Strengthening;Both;10  reps;Limitations    Retraction Limitations  10x3" cues to avoid shoulder hiking      Shoulder Exercises: Prone   Extension  Left;Strengthening;10 reps;Limitations    Extension Limitations  cues for scap retraction    Other Prone Exercises  L UE row 10x cues for scap retraction      Shoulder Exercises: Sidelying   External Rotation  AROM;Left;10 reps;Limitations    External Rotation Limitations  dowel under elbow for neutral shoulder    ABduction  AROM;Left;10 reps;Limitations    ABduction Limitations  thumb up; to tolerance      Shoulder Exercises: Standing   External Rotation  10 reps;Left;Theraband    Theraband Level (Shoulder External Rotation)  Level 1 (Yellow)    External Rotation Limitations  dowel under elbow    Internal Rotation  10 reps;Left;Theraband    Theraband Level (Shoulder Internal Rotation)  Level 1 (Yellow)    Internal Rotation Limitations  dowel under elbow      Shoulder Exercises: Pulleys   Flexion  3 minutes    Scaption  3 minutes      Moist Heat Therapy   Number Minutes Moist Heat  12 Minutes     Moist Heat Location  Shoulder L      Manual Therapy   Manual Therapy  Passive ROM;Joint mobilization;Soft tissue mobilization;Myofascial release    Manual therapy comments  supine     Joint Mobilization  gentle L shoulder inferior mobs/oscillations grade III; L wrist mobs grade III into flex/ex and radial/ulnar deviation    Soft tissue mobilization  STM to L later and infraspinatus/teres min- TTP and soft tissue restrction    Passive ROM  L shoulder PROM with gentle stretch all directions to tolerance; pt painful at end range but tolerable              PT Education - 07/23/17 1115    Education Details  Addition to HEP    Person(s) Educated  Patient    Methods  Explanation;Demonstration;Tactile cues;Verbal cues;Handout    Comprehension  Returned demonstration;Verbalized understanding       PT Short Term Goals - 07/15/17 0850      PT SHORT TERM GOAL #1   Title  Patient to be independent with initial HEP.    Time  4    Period  Weeks    Status  On-going        PT Long Term Goals - 07/15/17 0851      PT LONG TERM GOAL #1   Title  Patient to be independent with advanced HEP.    Time  8    Period  Weeks    Status  On-going      PT LONG TERM GOAL #2   Title  Patient to demonstrate L shoulder AROM/PROM WFL without pain limiting.     Time  8    Period  Weeks    Status  On-going      PT LONG TERM GOAL #3   Title  Patient to demonstrate WFL L wrist AROM/PROM without pain limiting.    Status  On-going      PT LONG TERM GOAL #4   Title  Patient to demonstrate >=4+/5 L UE strength.     Time  8    Period  Weeks    Status  On-going      PT LONG TERM GOAL #5   Title  Patient to demonstrate overhead reaching with L UE to retrieve 5lb object from cabinet.  Time  8    Period  Weeks    Status  On-going      PT LONG TERM GOAL #6   Title  Patient to report comfortable use of walking sticks in B UEs when hiking to return to recreational hiking hobby.     Time  8     Period  Weeks    Status  New            Plan - 07/23/17 1117    Clinical Impression Statement  Patient arrived to session with no new complaints. Reports compliance with HEP. Patient tolerated pulley warm-up without pain. Advised patient to add scaption plane to her pulley at home. Patient tolerated STM and PROM to L shoulder- soft tissue restriction and TTP in L lats and infraspinatus/teres minor group and patient visibly in pain with PROM, although tolerating well. Able to tolerate L wrist joint mobs into flexion, extension, and radial/ulnar deviation with less pain. Corrected patient's form with shoulder ER/IR and abduction AAROM, however patient with good range, tolerance, and form with AAROM flexion. Progressed patient with sidelying ER and ABD this date- no c/o pain. Patient reporting good benefit from heat- received moist heat to L shoulder with no c/o pain at end of session.    PT Treatment/Interventions  ADLs/Self Care Home Management;Cryotherapy;Electrical Stimulation;Moist Heat;Ultrasound;Functional mobility training;Therapeutic activities;Therapeutic exercise;Manual techniques;Patient/family education;Passive range of motion;Dry needling;Energy conservation;Splinting;Taping;Vasopneumatic Device    PT Next Visit Plan  reassess sidelying ER and abduction, AAROM abduction    Consulted and Agree with Plan of Care  Patient       Patient will benefit from skilled therapeutic intervention in order to improve the following deficits and impairments:  Hypomobility, Decreased activity tolerance, Decreased strength, Impaired UE functional use, Pain, Decreased mobility, Decreased range of motion, Postural dysfunction, Impaired flexibility  Visit Diagnosis: Pain in left upper arm  Stiffness of left shoulder, not elsewhere classified  Pain in left wrist  Stiffness of left wrist, not elsewhere classified  Muscle weakness (generalized)     Problem List Patient Active Problem List    Diagnosis Date Noted  . Lateral epicondylitis, left elbow 02/09/2017  . Metatarsalgia, left foot 10/09/2016  . Left shoulder pain 10/09/2016  . Right patellofemoral syndrome 12/28/2014  . Osteoarthritis of left hip 08/18/2013  . Hyperlipidemia 12/31/2011  . Tinea 12/25/2011  . Constipation 12/25/2011  . Vaginal dryness 12/25/2011  . Left lumbar radiculitis 11/27/2011  . Sciatica 11/18/2011  . Plantar fasciitis 07/06/2011  . Heel spur 07/06/2011  . Left leg injury 02/10/2011  . Dermatitis 02/10/2011  . Anemia 11/21/2010  . Irritable bowel 11/21/2010  . FH: breast cancer in first degree relative 10/31/2010  . Anxiety   . Menopausal hot flushes      Janene Harvey, [place], DPT 07/23/17 11:25 AM   [facility] [address]  [place], [place], [address] Phone: [phone]   Fax:  [phone]  Name: Danielle Arnold MRN: [id number] Date of Birth: 03/28/1960

## 2017-07-27 ENCOUNTER — Ambulatory Visit: Payer: Federal, State, Local not specified - PPO

## 2017-07-27 DIAGNOSIS — M79622 Pain in left upper arm: Secondary | ICD-10-CM

## 2017-07-27 DIAGNOSIS — M25532 Pain in left wrist: Secondary | ICD-10-CM | POA: Diagnosis not present

## 2017-07-27 DIAGNOSIS — M25632 Stiffness of left wrist, not elsewhere classified: Secondary | ICD-10-CM | POA: Diagnosis not present

## 2017-07-27 DIAGNOSIS — M6281 Muscle weakness (generalized): Secondary | ICD-10-CM

## 2017-07-27 DIAGNOSIS — M25612 Stiffness of left shoulder, not elsewhere classified: Secondary | ICD-10-CM | POA: Diagnosis not present

## 2017-07-27 NOTE — Therapy (Signed)
[facility] [address]  [place], [place], [address] Phone: [phone]   Fax:  [phone]  Physical Therapy Treatment  Patient Details  Name: Danielle Arnold MRN: [id number] Date of Birth: Sep 07, 1960 Referring Provider: Frankey Shown, MD   Encounter Date: 07/27/2017  PT End of Session - 07/27/17 1025    Visit Number  6    Number of Visits  17    Date for PT Re-Evaluation  08/31/17    Authorization Type  Federal [employer]; VL 50    PT Start Time  3267    PT Stop Time  1100    PT Time Calculation (min)  45 min    Activity Tolerance  Patient tolerated treatment well    Behavior During Therapy  [facility] for tasks assessed/performed       Past Medical History:  Diagnosis Date  . Abnormal Pap smear and cervical HPV (human papillomavirus) 1989   Cone biopsy  ????dysplasia  . Anxiety    mild on no meds  . Endometriosis   . Fatty liver   . Fibroid   . Infection    UTI  . Liver hemangioma   . Menopausal hot flushes   . Pure hypercholesterolemia   . Vitamin D insufficiency     Past Surgical History:  Procedure Laterality Date  . APPENDECTOMY  80  . CERVICAL CONE BIOPSY  89  . DILATION AND CURETTAGE OF UTERUS  3/11  . ear operation Bilateral 89 67 68   ear drum repair  . MYOMECTOMY  2000  . UTERINE FIBROID EMBOLIZATION  2003    There were no vitals filed for this visit.  Subjective Assessment - 07/27/17 1022    Subjective  Notes she is still having wrist pain.      Diagnostic tests  L shoulder xray 07/02/17: Significant improvement and healing of proximal humerus fracture. Alignment unchanged. L wrist xray 06/12/17: Generalized osteopenia.  No acute abnormalities.  Mild osteoarthritis.    Patient Stated Goals  get ROM back, have no pain    Currently in Pain?  Yes    Pain Score  6     Pain Location  Wrist    Pain Orientation  Left    Pain Descriptors / Indicators  Aching    Pain Type  Acute pain    Pain  Onset  More than a month ago    Aggravating Factors   squeezing activities    Multiple Pain Sites  Yes    Pain Score  5    Pain Location  Shoulder    Pain Orientation  Left    Pain Descriptors / Indicators  Aching    Pain Type  Acute pain    Pain Radiating Towards  extending into L arm     Pain Onset  More than a month ago    Pain Frequency  Constant    Aggravating Factors   raising arm overhead          [facility] PT Assessment - 07/27/17 1034      PROM   Right/Left Shoulder  Left    Left Shoulder Flexion  131 Degrees    Left Shoulder ABduction  100 Degrees    Left Shoulder Internal Rotation  66 Degrees    Left Shoulder External Rotation  67 Degrees                   [facility] Adult PT Treatment/Exercise - 07/27/17 1029  Shoulder Exercises: Supine   External Rotation  AAROM;Left;15 reps 5" hold     External Rotation Limitations  wand     ABduction  Left;AAROM;15 reps Cues required for proper motion     ABduction Limitations  wand       Shoulder Exercises: Sidelying   External Rotation  AROM;Left;Limitations;15 reps    External Rotation Limitations  Towel under elbow     ABduction  AROM;Left;Limitations;15 reps    ABduction Limitations  To tolerance ~ 0-95 dg       Shoulder Exercises: ROM/Strengthening   UBE (Upper Arm Bike)  LVl 2.0, 3 min forwards/backwards      Shoulder Exercises: Stretch   Cross Chest Stretch  2 reps;30 seconds low and mid doorway stretch       Manual Therapy   Manual Therapy  Passive ROM;Joint mobilization;Soft tissue mobilization;Myofascial release    Manual therapy comments  supine     Joint Mobilization  gentle L shoulder inferior mobs/oscillations grade III; L wrist mobs grade III into flex/ex and radial/ulnar deviation    Soft tissue mobilization  STM to L infraspinatus/teres min, L shoulder complex; some ttp in posterior/inferior shoulder and L proximal UE    Passive ROM  L shoulder PROM with gentle stretch all directions to  tolerance; pt painful at end range but tolerable              PT Education - 07/27/17 1211    Education Details  HEP update     Person(s) Educated  Patient    Methods  Explanation;Demonstration;Handout;Verbal cues    Comprehension  Verbalized understanding;Returned demonstration;Verbal cues required;Need further instruction       PT Short Term Goals - 07/15/17 0850      PT SHORT TERM GOAL #1   Title  Patient to be independent with initial HEP.    Time  4    Period  Weeks    Status  On-going        PT Long Term Goals - 07/15/17 0851      PT LONG TERM GOAL #1   Title  Patient to be independent with advanced HEP.    Time  8    Period  Weeks    Status  On-going      PT LONG TERM GOAL #2   Title  Patient to demonstrate L shoulder AROM/PROM WFL without pain limiting.     Time  8    Period  Weeks    Status  On-going      PT LONG TERM GOAL #3   Title  Patient to demonstrate WFL L wrist AROM/PROM without pain limiting.    Status  On-going      PT LONG TERM GOAL #4   Title  Patient to demonstrate >=4+/5 L UE strength.     Time  8    Period  Weeks    Status  On-going      PT LONG TERM GOAL #5   Title  Patient to demonstrate overhead reaching with L UE to retrieve 5lb object from cabinet.     Time  8    Period  Weeks    Status  On-going      PT LONG TERM GOAL #6   Title  Patient to report comfortable use of walking sticks in B UEs when hiking to return to recreational hiking hobby.     Time  8    Period  Weeks    Status  New  Plan - 07/27/17 1025    Clinical Impression Statement  Pt. doing well today noting she was able to participate in "spine class" and feels she tolerated this well.  Able to demo significant improvement in flexion, abduction PROM today however still most limited in IR/ER motions thus manual therapy addressing this with updated HEP to encourage pt. focus on these motions.  Klaryssa tolerated session well today however does  continue to have pain at end ranges of L shoulder motion with PROM activities in session which limit her tolerance.  Will continue to progress toward goals.      PT Treatment/Interventions  ADLs/Self Care Home Management;Cryotherapy;Electrical Stimulation;Moist Heat;Ultrasound;Functional mobility training;Therapeutic activities;Therapeutic exercise;Manual techniques;Patient/family education;Passive range of motion;Dry needling;Energy conservation;Splinting;Taping;Vasopneumatic Device    Consulted and Agree with Plan of Care  Patient       Patient will benefit from skilled therapeutic intervention in order to improve the following deficits and impairments:  Hypomobility, Decreased activity tolerance, Decreased strength, Impaired UE functional use, Pain, Decreased mobility, Decreased range of motion, Postural dysfunction, Impaired flexibility  Visit Diagnosis: Pain in left upper arm  Stiffness of left shoulder, not elsewhere classified  Pain in left wrist  Stiffness of left wrist, not elsewhere classified  Muscle weakness (generalized)     Problem List Patient Active Problem List   Diagnosis Date Noted  . Lateral epicondylitis, left elbow 02/09/2017  . Metatarsalgia, left foot 10/09/2016  . Left shoulder pain 10/09/2016  . Right patellofemoral syndrome 12/28/2014  . Osteoarthritis of left hip 08/18/2013  . Hyperlipidemia 12/31/2011  . Tinea 12/25/2011  . Constipation 12/25/2011  . Vaginal dryness 12/25/2011  . Left lumbar radiculitis 11/27/2011  . Sciatica 11/18/2011  . Plantar fasciitis 07/06/2011  . Heel spur 07/06/2011  . Left leg injury 02/10/2011  . Dermatitis 02/10/2011  . Anemia 11/21/2010  . Irritable bowel 11/21/2010  . FH: breast cancer in first degree relative 10/31/2010  . Anxiety   . Menopausal hot flushes     Bess Harvest, PTA 07/27/17 12:19 PM   [facility] [address]  [place],  [place], [address] Phone: [phone]   Fax:  [phone]  Name: JUHI LAGRANGE MRN: [id number] Date of Birth: 1960/08/06

## 2017-07-30 ENCOUNTER — Ambulatory Visit: Payer: Federal, State, Local not specified - PPO | Admitting: Physical Therapy

## 2017-07-30 ENCOUNTER — Encounter: Payer: Self-pay | Admitting: Physical Therapy

## 2017-07-30 DIAGNOSIS — M25632 Stiffness of left wrist, not elsewhere classified: Secondary | ICD-10-CM | POA: Diagnosis not present

## 2017-07-30 DIAGNOSIS — M6281 Muscle weakness (generalized): Secondary | ICD-10-CM

## 2017-07-30 DIAGNOSIS — M25612 Stiffness of left shoulder, not elsewhere classified: Secondary | ICD-10-CM

## 2017-07-30 DIAGNOSIS — M25532 Pain in left wrist: Secondary | ICD-10-CM | POA: Diagnosis not present

## 2017-07-30 DIAGNOSIS — M79622 Pain in left upper arm: Secondary | ICD-10-CM | POA: Diagnosis not present

## 2017-07-30 NOTE — Therapy (Signed)
[facility] [address]  [place], [place], [address] Phone: [phone]   Fax:  [phone]  Physical Therapy Treatment  Patient Details  Name: Danielle Arnold MRN: [id number] Date of Birth: 1960/01/15 Referring Provider: Frankey Shown, MD   Encounter Date: 07/30/2017  PT End of Session - 07/30/17 1202    Visit Number  7    Number of Visits  17    Date for PT Re-Evaluation  08/31/17    Authorization Type  Federal BCBS; VL 50    PT Start Time  0177    PT Stop Time  1114 miost heat    PT Time Calculation (min)  59 min    Activity Tolerance  Patient tolerated treatment well    Behavior During Therapy  [facility] for tasks assessed/performed       Past Medical History:  Diagnosis Date  . Abnormal Pap smear and cervical HPV (human papillomavirus) 1989   Cone biopsy  ????dysplasia  . Anxiety    mild on no meds  . Endometriosis   . Fatty liver   . Fibroid   . Infection    UTI  . Liver hemangioma   . Menopausal hot flushes   . Pure hypercholesterolemia   . Vitamin D insufficiency     Past Surgical History:  Procedure Laterality Date  . APPENDECTOMY  80  . CERVICAL CONE BIOPSY  89  . DILATION AND CURETTAGE OF UTERUS  3/11  . ear operation Bilateral 89 67 68   ear drum repair  . MYOMECTOMY  2000  . UTERINE FIBROID EMBOLIZATION  2003    There were no vitals filed for this visit.  Subjective Assessment - 07/30/17 1016    Subjective  Reports she was a bit sore after last session. Reports soreness dissipated by the afternoon. Has MD follow up on Aug 1st.    Diagnostic tests  L shoulder xray 07/02/17: Significant improvement and healing of proximal humerus fracture. Alignment unchanged. L wrist xray 06/12/17: Generalized osteopenia.  No acute abnormalities.  Mild osteoarthritis.    Patient Stated Goals  get ROM back, have no pain    Currently in Pain?  Yes    Pain Score  6     Pain Location  Wrist    Pain  Orientation  Left    Pain Descriptors / Indicators  Tightness    Pain Type  Acute pain    Multiple Pain Sites  Yes    Pain Score  6    Pain Location  Shoulder    Pain Orientation  Left    Pain Descriptors / Indicators  Tightness    Pain Type  Acute pain                       [facility] Adult PT Treatment/Exercise - 07/30/17 0001      Shoulder Exercises: Supine   Protraction  Strengthening;Left;10 reps;Weights    Protraction Weight (lbs)  1    Protraction Limitations  cues for form      Shoulder Exercises: Sidelying   External Rotation  AROM;Left;Limitations;10 reps    External Rotation Limitations  dowel under elbow; 10x 0#, 10x 1#    ABduction  AROM;Left;Limitations;10 reps    ABduction Limitations  to tolerance      Shoulder Exercises: Pulleys   Flexion  3 minutes    Scaption  3 minutes      Shoulder Exercises: Stretch  Corner Stretch  2 reps;20 seconds;Limitations    Warehouse manager Limitations  L 90/90 pec doorway stretch; advised patient to perform 1 UE at a time for better tolerance    Other Shoulder Stretches  B shoulder IR/ER stretch with strap; 5x10" each direction      Modalities   Modalities  Cryotherapy;Moist Heat      Moist Heat Therapy   Number Minutes Moist Heat  10 Minutes    Moist Heat Location  Shoulder L      Cryotherapy   Number Minutes Cryotherapy  10 Minutes    Cryotherapy Location  Wrist L    Type of Cryotherapy  Ice pack      Manual Therapy   Manual Therapy  Soft tissue mobilization    Manual therapy comments  R sidelying    Joint Mobilization  gentle grade II L shoulder mobs posterior and distraction; good tolerance; L wrist grade II mobs into flexion and extension    Soft tissue mobilization  L infraspinatus/teres min and UT, pec- TTP and significant soft tissue restrictio but tolerable    Passive ROM  B pec stretch 2x 20 sec; pt reporting pain but tolerable; L shoulder PROM in all planes- pt with most pain at end range ABD but  reports tolerance             PT Education - 07/30/17 1109    Education Details  HEP update and removed basic exercises- advised not to push into pain; review of pec stretch; edu on use of heat/ice packs for wrist and shoulder pain    Person(s) Educated  Patient    Methods  Explanation;Demonstration;Tactile cues;Verbal cues;Handout    Comprehension  Returned demonstration;Verbalized understanding       PT Short Term Goals - 07/15/17 0850      PT SHORT TERM GOAL #1   Title  Patient to be independent with initial HEP.    Time  4    Period  Weeks    Status  On-going        PT Long Term Goals - 07/15/17 0851      PT LONG TERM GOAL #1   Title  Patient to be independent with advanced HEP.    Time  8    Period  Weeks    Status  On-going      PT LONG TERM GOAL #2   Title  Patient to demonstrate L shoulder AROM/PROM WFL without pain limiting.     Time  8    Period  Weeks    Status  On-going      PT LONG TERM GOAL #3   Title  Patient to demonstrate WFL L wrist AROM/PROM without pain limiting.    Status  On-going      PT LONG TERM GOAL #4   Title  Patient to demonstrate >=4+/5 L UE strength.     Time  8    Period  Weeks    Status  On-going      PT LONG TERM GOAL #5   Title  Patient to demonstrate overhead reaching with L UE to retrieve 5lb object from cabinet.     Time  8    Period  Weeks    Status  On-going      PT LONG TERM GOAL #6   Title  Patient to report comfortable use of walking sticks in B UEs when hiking to return to recreational hiking hobby.     Time  8  Period  Weeks    Status  New            Plan - 07/30/17 1202    Clinical Impression Statement  Patient arrived to session with report of soreness after less session which dissipated by the afternoon. Patient reports she has been receiving massages which gives some additional relief to shoulder muscular pain. Focused on manual therapy this session as patient still with limited and painful  wrist and shoulder ROM. Palpable soft tissue restriction in L infraspinatus/teres minor with mild relief of symptoms after STM. Tolerated gentle L shoulder and wrist joint mobs and PROM with good tolerance by patient, however pain evident on patient's face. Wrist flexion and shoulder abduction being most painful/limited. Encouraged patient to speak up if treatment sessions are too painful- patient reported understanding. Patient tolerated serratus punch and sidelying ER with weighted resistance this date with good form. Demonstrated L UE pec stretch and advised patient to perform 1 UE at a time and avoid pushing into pain for better tolerance. Updated patient's HEP with additional exercises and removed basic ther-ex from list. Patient requesting moist heat to L shoulder at end of session, ice to wrist as it was slightly edematous. Normal integumentary response at end of session.     PT Treatment/Interventions  ADLs/Self Care Home Management;Cryotherapy;Electrical Stimulation;Moist Heat;Ultrasound;Functional mobility training;Therapeutic activities;Therapeutic exercise;Manual techniques;Patient/family education;Passive range of motion;Dry needling;Energy conservation;Splinting;Taping;Vasopneumatic Device    Consulted and Agree with Plan of Care  Patient       Patient will benefit from skilled therapeutic intervention in order to improve the following deficits and impairments:  Hypomobility, Decreased activity tolerance, Decreased strength, Impaired UE functional use, Pain, Decreased mobility, Decreased range of motion, Postural dysfunction, Impaired flexibility  Visit Diagnosis: Pain in left upper arm  Stiffness of left shoulder, not elsewhere classified  Pain in left wrist  Stiffness of left wrist, not elsewhere classified  Muscle weakness (generalized)     Problem List Patient Active Problem List   Diagnosis Date Noted  . Lateral epicondylitis, left elbow 02/09/2017  . Metatarsalgia, left  foot 10/09/2016  . Left shoulder pain 10/09/2016  . Right patellofemoral syndrome 12/28/2014  . Osteoarthritis of left hip 08/18/2013  . Hyperlipidemia 12/31/2011  . Tinea 12/25/2011  . Constipation 12/25/2011  . Vaginal dryness 12/25/2011  . Left lumbar radiculitis 11/27/2011  . Sciatica 11/18/2011  . Plantar fasciitis 07/06/2011  . Heel spur 07/06/2011  . Left leg injury 02/10/2011  . Dermatitis 02/10/2011  . Anemia 11/21/2010  . Irritable bowel 11/21/2010  . FH: breast cancer in first degree relative 10/31/2010  . Anxiety   . Menopausal hot flushes     Janene Harvey, [place], DPT 07/30/17 12:14 PM   [facility] [address]  [place], [place], [address] Phone: [phone]   Fax:  [phone]  Name: Danielle Arnold MRN: [id number] Date of Birth: 05/11/1960

## 2017-08-03 ENCOUNTER — Ambulatory Visit: Payer: Federal, State, Local not specified - PPO

## 2017-08-03 DIAGNOSIS — Z0000 Encounter for general adult medical examination without abnormal findings: Secondary | ICD-10-CM | POA: Diagnosis not present

## 2017-08-03 DIAGNOSIS — H7291 Unspecified perforation of tympanic membrane, right ear: Secondary | ICD-10-CM | POA: Diagnosis not present

## 2017-08-03 DIAGNOSIS — M25532 Pain in left wrist: Secondary | ICD-10-CM | POA: Diagnosis not present

## 2017-08-03 DIAGNOSIS — M79622 Pain in left upper arm: Secondary | ICD-10-CM | POA: Diagnosis not present

## 2017-08-03 DIAGNOSIS — R42 Dizziness and giddiness: Secondary | ICD-10-CM | POA: Diagnosis not present

## 2017-08-03 DIAGNOSIS — M6281 Muscle weakness (generalized): Secondary | ICD-10-CM

## 2017-08-03 DIAGNOSIS — E7800 Pure hypercholesterolemia, unspecified: Secondary | ICD-10-CM | POA: Diagnosis not present

## 2017-08-03 DIAGNOSIS — E559 Vitamin D deficiency, unspecified: Secondary | ICD-10-CM | POA: Diagnosis not present

## 2017-08-03 DIAGNOSIS — Z789 Other specified health status: Secondary | ICD-10-CM | POA: Diagnosis not present

## 2017-08-03 DIAGNOSIS — M25632 Stiffness of left wrist, not elsewhere classified: Secondary | ICD-10-CM

## 2017-08-03 DIAGNOSIS — R202 Paresthesia of skin: Secondary | ICD-10-CM | POA: Diagnosis not present

## 2017-08-03 DIAGNOSIS — M25612 Stiffness of left shoulder, not elsewhere classified: Secondary | ICD-10-CM

## 2017-08-03 MED FILL — NORTRIPTYLINE HCL 25 MG CAP: 25 | 30 days supply | Qty: 30 | Fill #0

## 2017-08-03 NOTE — Therapy (Signed)
[facility] [address]  [place], [place], [address] Phone: [phone]   Fax:  [phone]  Physical Therapy Treatment  Patient Details  Name: Danielle Arnold MRN: [id number] Date of Birth: 1960/11/22 Referring Provider: Frankey Shown, MD   Encounter Date: 08/03/2017  PT End of Session - 08/03/17 1456    Visit Number  8    Number of Visits  17    Date for PT Re-Evaluation  08/31/17    Authorization Type  Federal BCBS; VL 50    PT Start Time  1540    PT Stop Time  1527    PT Time Calculation (min)  40 min    Activity Tolerance  Patient tolerated treatment well    Behavior During Therapy  [facility] for tasks assessed/performed       Past Medical History:  Diagnosis Date  . Abnormal Pap smear and cervical HPV (human papillomavirus) 1989   Cone biopsy  ????dysplasia  . Anxiety    mild on no meds  . Endometriosis   . Fatty liver   . Fibroid   . Infection    UTI  . Liver hemangioma   . Menopausal hot flushes   . Pure hypercholesterolemia   . Vitamin D insufficiency     Past Surgical History:  Procedure Laterality Date  . APPENDECTOMY  80  . CERVICAL CONE BIOPSY  89  . DILATION AND CURETTAGE OF UTERUS  3/11  . ear operation Bilateral 89 67 68   ear drum repair  . MYOMECTOMY  2000  . UTERINE FIBROID EMBOLIZATION  2003    There were no vitals filed for this visit.  Subjective Assessment - 08/03/17 1454    Subjective  Pt. doing well.  Has had some recent dizziness with standing     Diagnostic tests  L shoulder xray 07/02/17: Significant improvement and healing of proximal humerus fracture. Alignment unchanged. L wrist xray 06/12/17: Generalized osteopenia.  No acute abnormalities.  Mild osteoarthritis.    Patient Stated Goals  get ROM back, have no pain    Currently in Pain?  Yes    Pain Score  -- 4.5/10    Pain Location  Wrist    Pain Orientation  Left    Pain Descriptors / Indicators  Tightness    Pain Type  Acute pain    Multiple Pain Sites  Yes    Pain Score  4    Pain Location  Shoulder    Pain Orientation  Left    Pain Descriptors / Indicators  Tightness    Pain Type  Acute pain                       [facility] Adult PT Treatment/Exercise - 08/03/17 1509      Shoulder Exercises: Seated   Other Seated Exercises  L wrist flexion/ext. stretch x 30 sec each way       Shoulder Exercises: Standing   External Rotation  Left;15 reps;Theraband;Strengthening    Theraband Level (Shoulder External Rotation)  Level 2 (Red)    Internal Rotation  Left;15 reps;Theraband;Strengthening    Theraband Level (Shoulder Internal Rotation)  Level 2 (Red)    Extension  Left;10 reps    Theraband Level (Shoulder Extension)  Level 2 (Red)      Shoulder Exercises: ROM/Strengthening   UBE (Upper Arm Bike)  LVl 2.0, 3 min forwards/backwards      Shoulder Exercises: Stretch  External Rotation Stretch  2 reps;30 seconds    Other Shoulder Stretches  L shoulder "sleeper stretch" 2 x 30 sec     External Rotation Stretch Limitations  L posterior shoulder stretch 2 x 30 sec       Manual Therapy   Manual Therapy  Soft tissue mobilization    Manual therapy comments  R sidelying    Joint Mobilization  gentle grade III L shoulder mobs posterior, inferior             PT Education - 08/03/17 1542    Education Details  HEP update    Person(s) Educated  Patient    Methods  Explanation;Demonstration;Verbal cues;Handout    Comprehension  Verbalized understanding;Returned demonstration;Verbal cues required;Need further instruction       PT Short Term Goals - 07/15/17 0850      PT SHORT TERM GOAL #1   Title  Patient to be independent with initial HEP.    Time  4    Period  Weeks    Status  On-going        PT Long Term Goals - 07/15/17 0851      PT LONG TERM GOAL #1   Title  Patient to be independent with advanced HEP.    Time  8    Period  Weeks    Status  On-going      PT  LONG TERM GOAL #2   Title  Patient to demonstrate L shoulder AROM/PROM WFL without pain limiting.     Time  8    Period  Weeks    Status  On-going      PT LONG TERM GOAL #3   Title  Patient to demonstrate WFL L wrist AROM/PROM without pain limiting.    Status  On-going      PT LONG TERM GOAL #4   Title  Patient to demonstrate >=4+/5 L UE strength.     Time  8    Period  Weeks    Status  On-going      PT LONG TERM GOAL #5   Title  Patient to demonstrate overhead reaching with L UE to retrieve 5lb object from cabinet.     Time  8    Period  Weeks    Status  On-going      PT LONG TERM GOAL #6   Title  Patient to report comfortable use of walking sticks in B UEs when hiking to return to recreational hiking hobby.     Time  8    Period  Weeks    Status  New            Plan - 08/03/17 1457    Clinical Impression Statement  Danielle Arnold reporting some recent dizziness with supine>sitting transition which she has mentioned to doctor and received medication for.  Did not complaint of dizziness in session today.  Tolerated progression of scapular and RTC strengthening activities well today.  Still somewhat limited in most L shoulder motions however with noted improvement since starting therapy.  Progressing well.      PT Treatment/Interventions  ADLs/Self Care Home Management;Cryotherapy;Electrical Stimulation;Moist Heat;Ultrasound;Functional mobility training;Therapeutic activities;Therapeutic exercise;Manual techniques;Patient/family education;Passive range of motion;Dry needling;Energy conservation;Splinting;Taping;Vasopneumatic Device    Consulted and Agree with Plan of Care  Patient       Patient will benefit from skilled therapeutic intervention in order to improve the following deficits and impairments:  Hypomobility, Decreased activity tolerance, Decreased strength, Impaired UE functional use, Pain, Decreased mobility,  Decreased range of motion, Postural dysfunction, Impaired  flexibility  Visit Diagnosis: Pain in left upper arm  Stiffness of left shoulder, not elsewhere classified  Pain in left wrist  Stiffness of left wrist, not elsewhere classified  Muscle weakness (generalized)     Problem List Patient Active Problem List   Diagnosis Date Noted  . Lateral epicondylitis, left elbow 02/09/2017  . Metatarsalgia, left foot 10/09/2016  . Left shoulder pain 10/09/2016  . Right patellofemoral syndrome 12/28/2014  . Osteoarthritis of left hip 08/18/2013  . Hyperlipidemia 12/31/2011  . Tinea 12/25/2011  . Constipation 12/25/2011  . Vaginal dryness 12/25/2011  . Left lumbar radiculitis 11/27/2011  . Sciatica 11/18/2011  . Plantar fasciitis 07/06/2011  . Heel spur 07/06/2011  . Left leg injury 02/10/2011  . Dermatitis 02/10/2011  . Anemia 11/21/2010  . Irritable bowel 11/21/2010  . FH: breast cancer in first degree relative 10/31/2010  . Anxiety   . Menopausal hot flushes     Danielle Arnold, Danielle Arnold 08/03/17 6:19 PM    [facility] [address]  [place], [place], [address] Phone: [phone]   Fax:  [phone]  Name: Danielle Arnold MRN: [id number] Date of Birth: [birth year]

## 2017-08-06 ENCOUNTER — Ambulatory Visit: Payer: Federal, State, Local not specified - PPO

## 2017-08-06 DIAGNOSIS — M25612 Stiffness of left shoulder, not elsewhere classified: Secondary | ICD-10-CM | POA: Diagnosis not present

## 2017-08-06 DIAGNOSIS — M25632 Stiffness of left wrist, not elsewhere classified: Secondary | ICD-10-CM

## 2017-08-06 DIAGNOSIS — M25532 Pain in left wrist: Secondary | ICD-10-CM

## 2017-08-06 DIAGNOSIS — M6281 Muscle weakness (generalized): Secondary | ICD-10-CM

## 2017-08-06 DIAGNOSIS — M79622 Pain in left upper arm: Secondary | ICD-10-CM

## 2017-08-06 NOTE — Therapy (Addendum)
[facility] [address]  [place], [place], [address] Phone: [phone]   Fax:  [phone]  Physical Therapy Treatment  Patient Details  Name: Danielle Arnold MRN: [id number] Date of Birth: [birth year] Referring Provider: Frankey Shown, MD   Encounter Date: 08/06/2017  PT End of Session - 08/06/17 0853    Visit Number  9    Number of Visits  17    Date for PT Re-Evaluation  08/31/17    Authorization Type  Federal [employer]BS; VL 50    PT Start Time  [phone]    PT Stop Time  0930    PT Time Calculation (min)  44 min    Activity Tolerance  Patient tolerated treatment well    Behavior During Therapy  [facility] for tasks assessed/performed       Past Medical History:  Diagnosis Date  . Abnormal Pap smear and cervical HPV (human papillomavirus) 1989   Cone biopsy  ????dysplasia  . Anxiety    mild on no meds  . Endometriosis   . Fatty liver   . Fibroid   . Infection    UTI  . Liver hemangioma   . Menopausal hot flushes   . Pure hypercholesterolemia   . Vitamin D insufficiency     Past Surgical History:  Procedure Laterality Date  . APPENDECTOMY  80  . CERVICAL CONE BIOPSY  89  . DILATION AND CURETTAGE OF UTERUS  3/11  . ear operation Bilateral 89 67 68   ear drum repair  . MYOMECTOMY  2000  . UTERINE FIBROID EMBOLIZATION  2003    There were no vitals filed for this visit.  Subjective Assessment - 08/06/17 0849    Subjective  Pt. noting dull, achy pain today in L wrist and shoulder which mild in nature and does not have a trigger.      Diagnostic tests  L shoulder xray 07/02/17: Significant improvement and healing of proximal humerus fracture. Alignment unchanged. L wrist xray 06/12/17: Generalized osteopenia.  No acute abnormalities.  Mild osteoarthritis.    Patient Stated Goals  get ROM back, have no pain    Currently in Pain?  Yes    Pain Score  4     Pain Location  Wrist    Pain Orientation  Left    Pain  Descriptors / Indicators  Tightness    Pain Type  Acute pain    Pain Onset  More than a month ago    Pain Frequency  Constant    Aggravating Factors   Worse in mornings     Pain Relieving Factors  twisting     Multiple Pain Sites  Yes    Pain Score  5    Pain Location  Shoulder    Pain Orientation  Left    Pain Descriptors / Indicators  Tightness    Pain Type  Acute pain    Pain Radiating Towards  extending into L arm     Pain Onset  More than a month ago    Pain Frequency  Constant    Aggravating Factors   Raising arm overhead     Pain Relieving Factors  heat                       [facility] Adult PT Treatment/Exercise - 08/06/17 0848      Shoulder Exercises: Supine   Protraction  Strengthening;Left;Weights;15 reps    Protraction Limitations  with     External Rotation  Left;10 reps;AAROM    External Rotation Limitations  wand 90/90 position     Other Supine Exercises  Supine shoulder circles CW, CCW x 15 reps with 1000Gr (2.2 lb)    Other Supine Exercises  Hooklying rhythmic stabilization in sustained 90 dg flexion       Shoulder Exercises: Prone   Flexion  Both x 2 reps     Flexion Limitations  Prone Y's on green p-ball  terminated due to difficulty performing proper motion     Extension  10 reps;Both;Strengthening    Extension Limitations  Prone I's on green p-ball     Horizontal ABduction 1  10 reps;Both;Strengthening    Horizontal ABduction 1 Limitations  Prone T's on green p-ball      Shoulder Exercises: Sidelying   External Rotation  15 reps;Left    External Rotation Limitations  1#    ABduction  15 reps;Left    ABduction Limitations  manual scapular upward rotation guidance and cues required to avoid excessive scap. elevation       Shoulder Exercises: Standing   External Rotation  Left;15 reps;Theraband;Strengthening    Theraband Level (Shoulder External Rotation)  Level 2 (Red)    Internal Rotation  Left;15 reps;Theraband;Strengthening    Theraband  Level (Shoulder Internal Rotation)  Level 2 (Red)      Shoulder Exercises: ROM/Strengthening   UBE (Upper Arm Bike)  LVl 2.0, 3 min forwards/backwards      Shoulder Exercises: Stretch   Other Shoulder Stretches  L shoulder "sleeper stretch" 2 x 30 sec       Manual Therapy   Manual Therapy  Passive ROM;Joint mobilization    Manual therapy comments  Hooklyting     Joint Mobilization  gentle grade III L shoulder mobs posterior, inferior    Passive ROM  PROM L shoulder all directions with gentle prolonged holds              PT Education - 08/06/17 1230    Education Details  HEP update    Person(s) Educated  Patient    Methods  Explanation;Demonstration;Verbal cues;Handout    Comprehension  Verbalized understanding;Returned demonstration;Verbal cues required;Need further instruction       PT Short Term Goals - 08/06/17 0853      PT SHORT TERM GOAL #1   Title  Patient to be independent with initial HEP.    Time  4    Period  Weeks    Status  Achieved        PT Long Term Goals - 07/15/17 6195      PT LONG TERM GOAL #1   Title  Patient to be independent with advanced HEP.    Time  8    Period  Weeks    Status  On-going      PT LONG TERM GOAL #2   Title  Patient to demonstrate L shoulder AROM/PROM WFL without pain limiting.     Time  8    Period  Weeks    Status  On-going      PT LONG TERM GOAL #3   Title  Patient to demonstrate WFL L wrist AROM/PROM without pain limiting.    Status  On-going      PT LONG TERM GOAL #4   Title  Patient to demonstrate >=4+/5 L UE strength.     Time  8    Period  Weeks    Status  On-going  PT LONG TERM GOAL #5   Title  Patient to demonstrate overhead reaching with L UE to retrieve 5lb object from cabinet.     Time  8    Period  Weeks    Status  On-going      PT LONG TERM GOAL #6   Title  Patient to report comfortable use of walking sticks in B UEs when hiking to return to recreational hiking hobby.     Time  8     Period  Weeks    Status  New            Plan - 08/06/17 0854    Clinical Impression Statement  Nitzia seen to start session reporting she has had some noticed increased, constant aching pain in wrist and shoulder today without known trigger.  Focused initially today on L shoulder PROM with gentle stretch into all motions then duration of visit focused on scapular/RTC strengthening activities, which were well tolerated.  Pt. seems to be most limited into PROM/AROM adduction at this point and manual mobs focused on improving this motion with tactile cueing with AROM to improve scapulohumeral rhythm.  Ended session with pt. noting decrease in pain levels.  Will continue to progress toward goals.      PT Treatment/Interventions  ADLs/Self Care Home Management;Cryotherapy;Electrical Stimulation;Moist Heat;Ultrasound;Functional mobility training;Therapeutic activities;Therapeutic exercise;Manual techniques;Patient/family education;Passive range of motion;Dry needling;Energy conservation;Splinting;Taping;Vasopneumatic Device    Consulted and Agree with Plan of Care  Patient       Patient will benefit from skilled therapeutic intervention in order to improve the following deficits and impairments:  Hypomobility, Decreased activity tolerance, Decreased strength, Impaired UE functional use, Pain, Decreased mobility, Decreased range of motion, Postural dysfunction, Impaired flexibility  Visit Diagnosis: Pain in left upper arm  Stiffness of left shoulder, not elsewhere classified  Pain in left wrist  Stiffness of left wrist, not elsewhere classified  Muscle weakness (generalized)     Problem List Patient Active Problem List   Diagnosis Date Noted  . Lateral epicondylitis, left elbow 02/09/2017  . Metatarsalgia, left foot 10/09/2016  . Left shoulder pain 10/09/2016  . Right patellofemoral syndrome 12/28/2014  . Osteoarthritis of left hip 08/18/2013  . Hyperlipidemia 12/31/2011  . Tinea  12/25/2011  . Constipation 12/25/2011  . Vaginal dryness 12/25/2011  . Left lumbar radiculitis 11/27/2011  . Sciatica 11/18/2011  . Plantar fasciitis 07/06/2011  . Heel spur 07/06/2011  . Left leg injury 02/10/2011  . Dermatitis 02/10/2011  . Anemia 11/21/2010  . Irritable bowel 11/21/2010  . FH: breast cancer in first degree relative 10/31/2010  . Anxiety   . Menopausal hot flushes     Bess Harvest, [place] 08/06/17 12:30 PM   [facility] [address]  [place], [place], [address] Phone: [phone]   Fax:  [phone]  Name: MONTRICE MONTUORI MRN: [id number] Date of Birth: 1960-06-15

## 2017-08-10 ENCOUNTER — Ambulatory Visit: Payer: Federal, State, Local not specified - PPO

## 2017-08-10 DIAGNOSIS — M25632 Stiffness of left wrist, not elsewhere classified: Secondary | ICD-10-CM | POA: Diagnosis not present

## 2017-08-10 DIAGNOSIS — M25612 Stiffness of left shoulder, not elsewhere classified: Secondary | ICD-10-CM

## 2017-08-10 DIAGNOSIS — M6281 Muscle weakness (generalized): Secondary | ICD-10-CM | POA: Diagnosis not present

## 2017-08-10 DIAGNOSIS — M79622 Pain in left upper arm: Secondary | ICD-10-CM

## 2017-08-10 DIAGNOSIS — M25532 Pain in left wrist: Secondary | ICD-10-CM | POA: Diagnosis not present

## 2017-08-10 NOTE — Therapy (Signed)
[facility] [address]  [place], [place], [address] Phone: [phone]   Fax:  [phone]  Physical Therapy Treatment  Patient Details  Name: Danielle Arnold MRN: [id number] Date of Birth: 02/12/1960 Referring Provider: Frankey Shown, MD   Encounter Date: 08/10/2017  PT End of Session - 08/10/17 1022    Visit Number  10    Number of Visits  17    Date for PT Re-Evaluation  08/31/17    Authorization Type  Federal BCBS; VL 50    PT Start Time  1018    PT Stop Time  1105    PT Time Calculation (min)  47 min    Activity Tolerance  Patient tolerated treatment well    Behavior During Therapy  [facility] for tasks assessed/performed       Past Medical History:  Diagnosis Date  . Abnormal Pap smear and cervical HPV (human papillomavirus) 1989   Cone biopsy  ????dysplasia  . Anxiety    mild on no meds  . Endometriosis   . Fatty liver   . Fibroid   . Infection    UTI  . Liver hemangioma   . Menopausal hot flushes   . Pure hypercholesterolemia   . Vitamin D insufficiency     Past Surgical History:  Procedure Laterality Date  . APPENDECTOMY  80  . CERVICAL CONE BIOPSY  89  . DILATION AND CURETTAGE OF UTERUS  3/11  . ear operation Bilateral 89 67 68   ear drum repair  . MYOMECTOMY  2000  . UTERINE FIBROID EMBOLIZATION  2003    There were no vitals filed for this visit.  Subjective Assessment - 08/10/17 1021    Subjective  Pt. noting she wishes the shoulder was further along in regards to motion.      Diagnostic tests  L shoulder xray 07/02/17: Significant improvement and healing of proximal humerus fracture. Alignment unchanged. L wrist xray 06/12/17: Generalized osteopenia.  No acute abnormalities.  Mild osteoarthritis.    Patient Stated Goals  get ROM back, have no pain    Currently in Pain?  Yes    Pain Score  4     Pain Location  Shoulder    Pain Orientation  Left    Pain Descriptors / Indicators   Tightness    Pain Type  Acute pain    Pain Radiating Towards  radiates into L wrist     Pain Onset  More than a month ago    Pain Frequency  Constant    Aggravating Factors   worse in mornings     Multiple Pain Sites  No         [facility] PT Assessment - 08/10/17 1029      Observation/Other Assessments   Focus on Therapeutic Outcomes (FOTO)   65% (35% limitation)      PROM   PROM Assessment Site  Shoulder;Wrist    Right/Left Shoulder  Left    Left Shoulder Flexion  137 Degrees    Left Shoulder ABduction  108 Degrees    Left Shoulder Internal Rotation  71 Degrees    Left Shoulder External Rotation  71 Degrees    Right/Left Wrist  Left    Left Wrist Extension  63 Degrees    Left Wrist Flexion  80 Degrees      Strength   Right Shoulder Flexion  4+/5    Right Shoulder ABduction  4+/5    Right  Shoulder Internal Rotation  4/5    Right Shoulder External Rotation  4/5    Left Shoulder Flexion  4-/5    Left Shoulder ABduction  3+/5    Left Shoulder Internal Rotation  4-/5    Left Shoulder External Rotation  3+/5    Right/Left Elbow  Right;Left    Right Elbow Flexion  4+/5    Right Elbow Extension  4+/5    Left Elbow Flexion  4/5    Left Elbow Extension  4-/5    Right/Left Wrist  Right;Left    Right Wrist Flexion  4+/5    Right Wrist Extension  4+/5    Left Wrist Flexion  4/5    Left Wrist Extension  4-/5                   [facility] Adult PT Treatment/Exercise - 08/10/17 1202      Self-Care   Self-Care  Other Self-Care Comments    Other Self-Care Comments   Discussion of proper technique with ER/IR band activities at home as pt. with questions regarding proper technique       Therapeutic Activites    Therapeutic Activities  Lifting    Lifting  5# L UE lift to 1st cabinet x 1 - moderate difficulty       Shoulder Exercises: Pulleys   Flexion  3 minutes    Scaption  3 minutes      Manual Therapy   Manual Therapy  Passive ROM;Joint mobilization    Manual therapy  comments  Hooklyting     Joint Mobilization  gentle grade III L shoulder mobs posterior, inferior    Soft tissue mobilization  L infraspinatus/teres min; pt. ttp however improved following manual STM with improved ROM following     Passive ROM  PROM L shoulder all directions with gentle prolonged holds for improved ROM                PT Short Term Goals - 08/06/17 0853      PT SHORT TERM GOAL #1   Title  Patient to be independent with initial HEP.    Time  4    Period  Weeks    Status  Achieved        PT Long Term Goals - 08/10/17 1026      PT LONG TERM GOAL #1   Title  Patient to be independent with advanced HEP.    Time  8    Period  Weeks    Status  Partially Met met for current       PT LONG TERM GOAL #2   Title  Patient to demonstrate L shoulder AROM/PROM [facility] without pain limiting.     Time  8    Period  Weeks    Status  On-going      PT LONG TERM GOAL #3   Title  Patient to demonstrate WFL L wrist AROM/PROM without pain limiting.    Status  On-going      PT LONG TERM GOAL #4   Title  Patient to demonstrate >=4+/5 L UE strength.     Time  8    Period  Weeks    Status  On-going      PT LONG TERM GOAL #5   Title  Patient to demonstrate overhead reaching with L UE to retrieve 5lb object from cabinet.     Time  8    Period  Weeks    Status  On-going      PT LONG TERM GOAL #6   Title  Patient to report comfortable use of walking sticks in B UEs when hiking to return to recreational hiking hobby.     Time  8    Period  Weeks    Status  Achieved            Plan - 08/10/17 1023    Clinical Impression Statement  Danielle Arnold reporting she feels ROM is coming along slowly at this point in therapy.  Pt. able to demo improvement in nearly all L shoulder ROM today with most improvement in abduction.  Does still present with soft tissue restrictions in posterior/inferior shoulder likely contributing to limited ROM.  Manual therapy targeting posterior/inferior  shoulder with STM/TPR with decreased tenderness and improved overhead ROM following this today.  Danielle Arnold able to demo improvement in nearly all L UE muscle groups today with MMT.  Did demo difficulty lifting 5# to first cabinet over counter today however, reports improved ease using walking sticks while hiking now.  Will continue to progress toward established goals.      PT Treatment/Interventions  ADLs/Self Care Home Management;Cryotherapy;Electrical Stimulation;Moist Heat;Ultrasound;Functional mobility training;Therapeutic activities;Therapeutic exercise;Manual techniques;Patient/family education;Passive range of motion;Dry needling;Energy conservation;Splinting;Taping;Vasopneumatic Device    Consulted and Agree with Plan of Care  Patient       Patient will benefit from skilled therapeutic intervention in order to improve the following deficits and impairments:  Hypomobility, Decreased activity tolerance, Decreased strength, Impaired UE functional use, Pain, Decreased mobility, Decreased range of motion, Postural dysfunction, Impaired flexibility  Visit Diagnosis: Pain in left upper arm  Stiffness of left shoulder, not elsewhere classified  Pain in left wrist  Stiffness of left wrist, not elsewhere classified  Muscle weakness (generalized)     Problem List Patient Active Problem List   Diagnosis Date Noted  . Lateral epicondylitis, left elbow 02/09/2017  . Metatarsalgia, left foot 10/09/2016  . Left shoulder pain 10/09/2016  . Right patellofemoral syndrome 12/28/2014  . Osteoarthritis of left hip 08/18/2013  . Hyperlipidemia 12/31/2011  . Tinea 12/25/2011  . Constipation 12/25/2011  . Vaginal dryness 12/25/2011  . Left lumbar radiculitis 11/27/2011  . Sciatica 11/18/2011  . Plantar fasciitis 07/06/2011  . Heel spur 07/06/2011  . Left leg injury 02/10/2011  . Dermatitis 02/10/2011  . Anemia 11/21/2010  . Irritable bowel 11/21/2010  . FH: breast cancer in first degree  relative 10/31/2010  . Anxiety   . Menopausal hot flushes     Bess Harvest, PTA 08/10/17 12:16 PM   [facility] [address]  [place], [place], [address] Phone: [phone]   Fax:  [phone]  Name: Danielle Arnold MRN: [id number] Date of Birth: 1960-04-18

## 2017-08-13 ENCOUNTER — Encounter: Payer: Self-pay | Admitting: Physical Therapy

## 2017-08-13 ENCOUNTER — Encounter (INDEPENDENT_AMBULATORY_CARE_PROVIDER_SITE_OTHER): Payer: Self-pay | Admitting: Orthopaedic Surgery

## 2017-08-13 ENCOUNTER — Ambulatory Visit (INDEPENDENT_AMBULATORY_CARE_PROVIDER_SITE_OTHER): Payer: Self-pay

## 2017-08-13 ENCOUNTER — Ambulatory Visit (INDEPENDENT_AMBULATORY_CARE_PROVIDER_SITE_OTHER): Payer: Federal, State, Local not specified - PPO | Admitting: Orthopaedic Surgery

## 2017-08-13 ENCOUNTER — Ambulatory Visit: Payer: Federal, State, Local not specified - PPO | Attending: Orthopaedic Surgery | Admitting: Physical Therapy

## 2017-08-13 DIAGNOSIS — M79622 Pain in left upper arm: Secondary | ICD-10-CM | POA: Diagnosis not present

## 2017-08-13 DIAGNOSIS — M25532 Pain in left wrist: Secondary | ICD-10-CM | POA: Diagnosis not present

## 2017-08-13 DIAGNOSIS — M25632 Stiffness of left wrist, not elsewhere classified: Secondary | ICD-10-CM | POA: Diagnosis not present

## 2017-08-13 DIAGNOSIS — S42295A Other nondisplaced fracture of upper end of left humerus, initial encounter for closed fracture: Secondary | ICD-10-CM

## 2017-08-13 DIAGNOSIS — R42 Dizziness and giddiness: Secondary | ICD-10-CM | POA: Diagnosis not present

## 2017-08-13 DIAGNOSIS — M25612 Stiffness of left shoulder, not elsewhere classified: Secondary | ICD-10-CM

## 2017-08-13 DIAGNOSIS — M6281 Muscle weakness (generalized): Secondary | ICD-10-CM

## 2017-08-13 NOTE — Therapy (Signed)
[facility] [address]  [place], [place], [address] Phone: [phone]   Fax:  [phone]  Physical Therapy Treatment  Patient Details  Name: Danielle Arnold MRN: [id number] Date of Birth: Mar 26, 1960 Referring Provider: Frankey Shown, MD   Encounter Date: 08/13/2017  PT End of Session - 08/13/17 1400    Visit Number  11    Number of Visits  17    Date for PT Re-Evaluation  08/31/17    Authorization Type  Federal [employer]; VL 50    PT Start Time  1314    PT Stop Time  1357    PT Time Calculation (min)  43 min    Activity Tolerance  Patient tolerated treatment well    Behavior During Therapy  [facility] for tasks assessed/performed       Past Medical History:  Diagnosis Date  . Abnormal Pap smear and cervical HPV (human papillomavirus) 1989   Cone biopsy  ????dysplasia  . Anxiety    mild on no meds  . Endometriosis   . Fatty liver   . Fibroid   . Infection    UTI  . Liver hemangioma   . Menopausal hot flushes   . Pure hypercholesterolemia   . Vitamin D insufficiency     Past Surgical History:  Procedure Laterality Date  . APPENDECTOMY  80  . CERVICAL CONE BIOPSY  89  . DILATION AND CURETTAGE OF UTERUS  3/11  . ear operation Bilateral 89 67 68   ear drum repair  . MYOMECTOMY  2000  . UTERINE FIBROID EMBOLIZATION  2003    There were no vitals filed for this visit.  Subjective Assessment - 08/13/17 1315    Subjective  Patient reporting she saw MD today who told her that fx is fully healed and to continue with PT 1x/week.     Diagnostic tests  L shoulder xray 07/02/17: Significant improvement and healing of proximal humerus fracture. Alignment unchanged. L wrist xray 06/12/17: Generalized osteopenia.  No acute abnormalities.  Mild osteoarthritis.    Patient Stated Goals  get ROM back, have no pain    Currently in Pain?  Yes    Pain Score  4     Pain Location  Shoulder    Pain Orientation  Left    Pain  Descriptors / Indicators  Tightness    Pain Type  Acute pain                       [facility] Adult PT Treatment/Exercise - 08/13/17 0001      Shoulder Exercises: Supine   Protraction  Strengthening;Weights;15 reps;Both    Protraction Weight (lbs)  2      Shoulder Exercises: Seated   Horizontal ABduction  Strengthening;Both;10 reps;Theraband;Limitations    Theraband Level (Shoulder Horizontal ABduction)  Level 1 (Yellow)    Horizontal ABduction Limitations  2x10; cues for scap retraction    External Rotation  Strengthening;Both;10 reps;Theraband;Limitations    Theraband Level (Shoulder External Rotation)  Level 1 (Yellow)    External Rotation Limitations  2x10; VCs for scap squeeze and elbows in      Shoulder Exercises: Prone   Extension  10 reps;Both;Strengthening;Weights    Extension Weight (lbs)  1    Extension Limitations  2x10; Prone I's on green p-ball     Other Prone Exercises  prone row L UE 2x10 2# cues for form    Other Prone Exercises  prone  T L UE 2x10 cues for scap retraction      Shoulder Exercises: Sidelying   External Rotation  Strengthening;10 reps;Limitations;Weights;Left    External Rotation Weight (lbs)  2    External Rotation Limitations  dowel under elbow; 2x10    ABduction  AROM;10 reps;Limitations;Weights;Left    ABduction Weight (lbs)  1    ABduction Limitations  thumb up; to tolerance      Shoulder Exercises: ROM/Strengthening   UBE (Upper Arm Bike)  L1 3/3      Shoulder Exercises: Stretch   Corner Stretch  2 reps;20 seconds;Limitations advised not to push into pain    Corner Stretch Limitations  L 90/90 pec doorway stretch; advised patient to perform 1 UE at a time for better tolerance    Other Shoulder Stretches  B shoulder IR/ER stretch with strap; 5x5" each direction      Manual Therapy   Manual Therapy  Soft tissue mobilization;Passive ROM    Manual therapy comments  Hooklying, sidelying    Soft tissue mobilization  L  infraspinatus/teres min; soft tissue restriction in these areas    Passive ROM  PROM L shoulder all directions with gentle prolonged holds for improved ROM              PT Education - 08/13/17 1359    Education Details  Addition to HEP    Person(s) Educated  Patient    Methods  Explanation;Demonstration;Verbal cues;Handout;Tactile cues    Comprehension  Returned demonstration;Verbalized understanding       PT Short Term Goals - 08/06/17 0853      PT SHORT TERM GOAL #1   Title  Patient to be independent with initial HEP.    Time  4    Period  Weeks    Status  Achieved        PT Long Term Goals - 08/10/17 1026      PT LONG TERM GOAL #1   Title  Patient to be independent with advanced HEP.    Time  8    Period  Weeks    Status  Partially Met met for current       PT LONG TERM GOAL #2   Title  Patient to demonstrate L shoulder AROM/PROM [facility] without pain limiting.     Time  8    Period  Weeks    Status  On-going      PT LONG TERM GOAL #3   Title  Patient to demonstrate WFL L wrist AROM/PROM without pain limiting.    Status  On-going      PT LONG TERM GOAL #4   Title  Patient to demonstrate >=4+/5 L UE strength.     Time  8    Period  Weeks    Status  On-going      PT LONG TERM GOAL #5   Title  Patient to demonstrate overhead reaching with L UE to retrieve 5lb object from cabinet.     Time  8    Period  Weeks    Status  On-going      PT LONG TERM GOAL #6   Title  Patient to report comfortable use of walking sticks in B UEs when hiking to return to recreational hiking hobby.     Time  8    Period  Weeks    Status  Achieved            Plan - 08/13/17 1400    Clinical Impression Statement  Patient arrived with no new complaints. Saw MD who reported that fx is fully healed at this time. MD also requested to continue 1x/week with PT. Good tolerance of L shoulder PROM and STM this date- still with painful soft tissue restriction in L infraspinatus and  teres minor. Good form with addition of weight with prone I, T and rows. Intermittent cues to correct but good effort throughout. Patient requesting additional HEP exercsies- administered HEP handout with today's ther-ex. Patient reported understanding. Offered ice at end of session- patient declined noting low pain levels. Patient showing good tolerance of progressive strengthening this date- plan to progress per POC.    PT Treatment/Interventions  ADLs/Self Care Home Management;Cryotherapy;Electrical Stimulation;Moist Heat;Ultrasound;Functional mobility training;Therapeutic activities;Therapeutic exercise;Manual techniques;Patient/family education;Passive range of motion;Dry needling;Energy conservation;Splinting;Taping;Vasopneumatic Device    Consulted and Agree with Plan of Care  Patient       Patient will benefit from skilled therapeutic intervention in order to improve the following deficits and impairments:  Hypomobility, Decreased activity tolerance, Decreased strength, Impaired UE functional use, Pain, Decreased mobility, Decreased range of motion, Postural dysfunction, Impaired flexibility  Visit Diagnosis: Pain in left upper arm  Stiffness of left shoulder, not elsewhere classified  Pain in left wrist  Stiffness of left wrist, not elsewhere classified  Muscle weakness (generalized)     Problem List Patient Active Problem List   Diagnosis Date Noted  . Lateral epicondylitis, left elbow 02/09/2017  . Metatarsalgia, left foot 10/09/2016  . Left shoulder pain 10/09/2016  . Right patellofemoral syndrome 12/28/2014  . Osteoarthritis of left hip 08/18/2013  . Hyperlipidemia 12/31/2011  . Tinea 12/25/2011  . Constipation 12/25/2011  . Vaginal dryness 12/25/2011  . Left lumbar radiculitis 11/27/2011  . Sciatica 11/18/2011  . Plantar fasciitis 07/06/2011  . Heel spur 07/06/2011  . Left leg injury 02/10/2011  . Dermatitis 02/10/2011  . Anemia 11/21/2010  . Irritable bowel  11/21/2010  . FH: breast cancer in first degree relative 10/31/2010  . Anxiety   . Menopausal hot flushes     Janene Harvey, [place], DPT 08/13/17 2:03 PM   [facility] [address]  [place], [place], [address] Phone: [phone]   Fax:  [phone]  Name: VERDIA BOLT MRN: [id number] Date of Birth: 01-23-1960

## 2017-08-13 NOTE — Progress Notes (Signed)
Post-Op Visit Note   Patient: Danielle Arnold           Date of Birth: 1960-10-27           MRN: [id number] Visit Date: 08/13/2017 PCP: Lanice Shirts, MD   Assessment & Plan:  Chief Complaint:  Chief Complaint  Patient presents with  . Left Shoulder - Pain   Visit Diagnoses:  1. Other closed nondisplaced fracture of proximal end of left humerus, initial encounter     Plan: Patient is 3 months post left proximal humerus fracture.  She is overall doing much better.  She is working with physical therapy.  Her range of motion is progressing and improving.  She does not have any pain at rest and does not require any pain medicines.  Her x-rays demonstrate healing of the fracture.  At this point she may use drop back to just 1 week physical therapy along with home exercises.  Continue to work on range of motion.  Questions encouraged and answered.  Follow-up as needed.  Follow-Up Instructions: Return if symptoms worsen or fail to improve.   Orders:  Orders Placed This Encounter  Procedures  . XR Shoulder Left   No orders of the defined types were placed in this encounter.   Imaging: Xr Shoulder Left  Result Date: 08/13/2017 Healed proximal humerus fracture and slight varus but overall alignment is acceptable.   PMFS History: Patient Active Problem List   Diagnosis Date Noted  . Lateral epicondylitis, left elbow 02/09/2017  . Metatarsalgia, left foot 10/09/2016  . Left shoulder pain 10/09/2016  . Right patellofemoral syndrome 12/28/2014  . Osteoarthritis of left hip 08/18/2013  . Hyperlipidemia 12/31/2011  . Tinea 12/25/2011  . Constipation 12/25/2011  . Vaginal dryness 12/25/2011  . Left lumbar radiculitis 11/27/2011  . Sciatica 11/18/2011  . Plantar fasciitis 07/06/2011  . Heel spur 07/06/2011  . Left leg injury 02/10/2011  . Dermatitis 02/10/2011  . Anemia 11/21/2010  . Irritable bowel 11/21/2010  . FH: breast cancer in first degree relative 10/31/2010   . Anxiety   . Menopausal hot flushes    Past Medical History:  Diagnosis Date  . Abnormal Pap smear and cervical HPV (human papillomavirus) 1989   Cone biopsy  ????dysplasia  . Anxiety    mild on no meds  . Endometriosis   . Fatty liver   . Fibroid   . Infection    UTI  . Liver hemangioma   . Menopausal hot flushes   . Pure hypercholesterolemia   . Vitamin D insufficiency     Family History  Problem Relation Age of Onset  . Dementia Mother   . Cancer Sister        breast  premenopausal  . Cancer Maternal Grandmother        breast  . Sudden death Neg Hx   . Hypertension Neg Hx   . Heart attack Neg Hx   . Hyperlipidemia Neg Hx     Past Surgical History:  Procedure Laterality Date  . APPENDECTOMY  80  . CERVICAL CONE BIOPSY  89  . DILATION AND CURETTAGE OF UTERUS  3/11  . ear operation Bilateral 89 67 68   ear drum repair  . MYOMECTOMY  2000  . UTERINE FIBROID EMBOLIZATION  2003   Social History   Occupational History  . Not on file  Tobacco Use  . Smoking status: Never Smoker  . Smokeless tobacco: Never Used  Substance and Sexual Activity  .  Alcohol use: Yes    Alcohol/week: 0.6 oz    Types: 1 Glasses of wine per week  . Drug use: No  . Sexual activity: Never    Partners: Male

## 2017-08-17 ENCOUNTER — Ambulatory Visit: Payer: Federal, State, Local not specified - PPO | Admitting: Physical Therapy

## 2017-08-17 DIAGNOSIS — Z0000 Encounter for general adult medical examination without abnormal findings: Secondary | ICD-10-CM | POA: Diagnosis not present

## 2017-08-20 ENCOUNTER — Ambulatory Visit: Payer: Federal, State, Local not specified - PPO

## 2017-08-20 DIAGNOSIS — R42 Dizziness and giddiness: Secondary | ICD-10-CM

## 2017-08-20 DIAGNOSIS — M25632 Stiffness of left wrist, not elsewhere classified: Secondary | ICD-10-CM

## 2017-08-20 DIAGNOSIS — M25612 Stiffness of left shoulder, not elsewhere classified: Secondary | ICD-10-CM | POA: Diagnosis not present

## 2017-08-20 DIAGNOSIS — M6281 Muscle weakness (generalized): Secondary | ICD-10-CM

## 2017-08-20 DIAGNOSIS — M25532 Pain in left wrist: Secondary | ICD-10-CM

## 2017-08-20 DIAGNOSIS — M79622 Pain in left upper arm: Secondary | ICD-10-CM

## 2017-08-21 NOTE — Therapy (Signed)
[facility] [address]  [place], [place], [address] Phone: [phone]   Fax:  [phone]  Physical Therapy Treatment  Patient Details  Name: CONNA TERADA MRN: [id number] Date of Birth: 04-05-1960 Referring Provider: Frankey Shown, MD   Encounter Date: 08/20/2017  PT End of Session - 08/20/17 0857    Visit Number  12    Number of Visits  17    Date for PT Re-Evaluation  08/31/17    Authorization Type  Federal BCBS; VL 50    PT Start Time  0846    PT Stop Time  0928    PT Time Calculation (min)  42 min    Activity Tolerance  Patient tolerated treatment well    Behavior During Therapy  [facility] for tasks assessed/performed       Past Medical History:  Diagnosis Date  . Abnormal Pap smear and cervical HPV (human papillomavirus) 1989   Cone biopsy  ????dysplasia  . Anxiety    mild on no meds  . Endometriosis   . Fatty liver   . Fibroid   . Infection    UTI  . Liver hemangioma   . Menopausal hot flushes   . Pure hypercholesterolemia   . Vitamin D insufficiency     Past Surgical History:  Procedure Laterality Date  . APPENDECTOMY  80  . CERVICAL CONE BIOPSY  89  . DILATION AND CURETTAGE OF UTERUS  3/11  . ear operation Bilateral 89 67 68   ear drum repair  . MYOMECTOMY  2000  . UTERINE FIBROID EMBOLIZATION  2003    There were no vitals filed for this visit.  Subjective Assessment - 08/20/17 0857    Subjective  Pt. noting no real pain only L shoulder stiffness.      Diagnostic tests  L shoulder xray 07/02/17: Significant improvement and healing of proximal humerus fracture. Alignment unchanged. L wrist xray 06/12/17: Generalized osteopenia.  No acute abnormalities.  Mild osteoarthritis.    Patient Stated Goals  get ROM back, have no pain    Currently in Pain?  No/denies    Pain Score  0-No pain    Multiple Pain Sites  No                       [facility] Adult PT Treatment/Exercise -  08/20/17 0909      Shoulder Exercises: Supine   Protraction  Both;20 reps;Strengthening    Protraction Weight (lbs)  3      Shoulder Exercises: Standing   External Rotation  Left;15 reps;Theraband;Strengthening    Theraband Level (Shoulder External Rotation)  Level 2 (Red)    Internal Rotation  Left;15 reps;Theraband;Strengthening    Theraband Level (Shoulder Internal Rotation)  Level 2 (Red)      Shoulder Exercises: ROM/Strengthening   UBE (Upper Arm Bike)  L2.0 3/3      Shoulder Exercises: Stretch   External Rotation Stretch  2 reps;30 seconds    Other Shoulder Stretches  Childs pose L shoulder flexion stretch x 30 sec     Other Shoulder Stretches  L shoulder "sleeper stretch" 2 x 30 sec     External Rotation Stretch Limitations  on doorframe      Manual Therapy   Manual Therapy  Soft tissue mobilization;Passive ROM;Scapular mobilization    Manual therapy comments  prone, supine     Soft tissue mobilization  L infraspinatus/teres min; ttp    Scapular  Mobilization  prone medial scapular mobs with prolonged holds for posterior RTC stretch with L arm anchored in sustained flexion     Passive ROM  PROM L shoulder all directions with gentle prolonged holds for improved ROM                PT Short Term Goals - 08/06/17 0853      PT SHORT TERM GOAL #1   Title  Patient to be independent with initial HEP.    Time  4    Period  Weeks    Status  Achieved        PT Long Term Goals - 08/10/17 1026      PT LONG TERM GOAL #1   Title  Patient to be independent with advanced HEP.    Time  8    Period  Weeks    Status  Partially Met   met for current      PT LONG TERM GOAL #2   Title  Patient to demonstrate L shoulder AROM/PROM [facility] without pain limiting.     Time  8    Period  Weeks    Status  On-going      PT LONG TERM GOAL #3   Title  Patient to demonstrate WFL L wrist AROM/PROM without pain limiting.    Status  On-going      PT LONG TERM GOAL #4   Title   Patient to demonstrate >=4+/5 L UE strength.     Time  8    Period  Weeks    Status  On-going      PT LONG TERM GOAL #5   Title  Patient to demonstrate overhead reaching with L UE to retrieve 5lb object from cabinet.     Time  8    Period  Weeks    Status  On-going      PT LONG TERM GOAL #6   Title  Patient to report comfortable use of walking sticks in B UEs when hiking to return to recreational hiking hobby.     Time  8    Period  Weeks    Status  Achieved            Plan - 08/20/17 0858    Clinical Impression Statement  Zhao reporting ongoing "stiffness", in L shoulder which is primary complaint now.  Session focusing on manual STM/passive stretching, and scapular mobs for improved L shoulder flexibility and ROM.  Pt. tolerated all activities in session well today noting decrease tightness following manual therapy.  Duration of session focused on gentle scapular/RTC strengthening to pt. tolerance.  Will continue to progress toward goals.      PT Treatment/Interventions  ADLs/Self Care Home Management;Cryotherapy;Electrical Stimulation;Moist Heat;Ultrasound;Functional mobility training;Therapeutic activities;Therapeutic exercise;Manual techniques;Patient/family education;Passive range of motion;Dry needling;Energy conservation;Splinting;Taping;Vasopneumatic Device    Consulted and Agree with Plan of Care  Patient       Patient will benefit from skilled therapeutic intervention in order to improve the following deficits and impairments:  Hypomobility, Decreased activity tolerance, Decreased strength, Impaired UE functional use, Pain, Decreased mobility, Decreased range of motion, Postural dysfunction, Impaired flexibility  Visit Diagnosis: Pain in left upper arm  Stiffness of left shoulder, not elsewhere classified  Pain in left wrist  Stiffness of left wrist, not elsewhere classified  Muscle weakness (generalized)  Dizziness and giddiness     Problem  List Patient Active Problem List   Diagnosis Date Noted  . Lateral epicondylitis, left elbow 02/09/2017  .  Metatarsalgia, left foot 10/09/2016  . Left shoulder pain 10/09/2016  . Right patellofemoral syndrome 12/28/2014  . Osteoarthritis of left hip 08/18/2013  . Hyperlipidemia 12/31/2011  . Tinea 12/25/2011  . Constipation 12/25/2011  . Vaginal dryness 12/25/2011  . Left lumbar radiculitis 11/27/2011  . Sciatica 11/18/2011  . Plantar fasciitis 07/06/2011  . Heel spur 07/06/2011  . Left leg injury 02/10/2011  . Dermatitis 02/10/2011  . Anemia 11/21/2010  . Irritable bowel 11/21/2010  . FH: breast cancer in first degree relative 10/31/2010  . Anxiety   . Menopausal hot flushes     Bess Harvest, PTA 08/21/17 12:47 PM   [facility] [address]  [place], [place], [address] Phone: [phone]   Fax:  [phone]  Name: SYMPHONIE SCHNEIDERMAN MRN: [id number] Date of Birth: Oct 17, 1960

## 2017-08-24 ENCOUNTER — Encounter: Payer: Federal, State, Local not specified - PPO | Admitting: Physical Therapy

## 2017-08-27 ENCOUNTER — Encounter: Payer: Federal, State, Local not specified - PPO | Admitting: Physical Therapy

## 2017-09-16 ENCOUNTER — Ambulatory Visit: Payer: Federal, State, Local not specified - PPO | Attending: Orthopaedic Surgery

## 2017-09-16 DIAGNOSIS — M79622 Pain in left upper arm: Secondary | ICD-10-CM | POA: Diagnosis not present

## 2017-09-16 DIAGNOSIS — M25532 Pain in left wrist: Secondary | ICD-10-CM | POA: Diagnosis not present

## 2017-09-16 DIAGNOSIS — M25632 Stiffness of left wrist, not elsewhere classified: Secondary | ICD-10-CM | POA: Diagnosis not present

## 2017-09-16 DIAGNOSIS — M25612 Stiffness of left shoulder, not elsewhere classified: Secondary | ICD-10-CM

## 2017-09-16 DIAGNOSIS — M6281 Muscle weakness (generalized): Secondary | ICD-10-CM | POA: Diagnosis not present

## 2017-09-16 NOTE — Therapy (Addendum)
[facility] [address]  [place], [place], [address] Phone: [phone]   Fax:  [phone]  Physical Therapy Treatment  Patient Details  Name: Danielle Arnold MRN: [id number] Date of Birth: [birth year] Referring Provider: Frankey Shown, MD   Encounter Date: 09/16/2017  PT End of Session - 09/16/17 1417    Visit Number  13    Number of Visits  17    Date for PT Re-Evaluation  08/31/17    Authorization Type  Federal BCBS; VL 50    PT Start Time  1401    PT Stop Time  1500    PT Time Calculation (min)  59 min    Activity Tolerance  Patient tolerated treatment well    Behavior During Therapy  [facility] for tasks assessed/performed       Past Medical History:  Diagnosis Date  . Abnormal Pap smear and cervical HPV (human papillomavirus) 1989   Cone biopsy  ????dysplasia  . Anxiety    mild on no meds  . Endometriosis   . Fatty liver   . Fibroid   . Infection    UTI  . Liver hemangioma   . Menopausal hot flushes   . Pure hypercholesterolemia   . Vitamin D insufficiency     Past Surgical History:  Procedure Laterality Date  . APPENDECTOMY  80  . CERVICAL CONE BIOPSY  89  . DILATION AND CURETTAGE OF UTERUS  3/11  . ear operation Bilateral 89 67 68   ear drum repair  . MYOMECTOMY  2000  . UTERINE FIBROID EMBOLIZATION  2003    There were no vitals filed for this visit.  Subjective Assessment - 09/16/17 1520    Subjective  Reports she has been in [place] for three weeks as Mother passed away.      Diagnostic tests  L shoulder xray 07/02/17: Significant improvement and healing of proximal humerus fracture. Alignment unchanged. L wrist xray 06/12/17: Generalized osteopenia.  No acute abnormalities.  Mild osteoarthritis.    Patient Stated Goals  get ROM back, have no pain    Currently in Pain?  No/denies    Pain Score  0-No pain    Multiple Pain Sites  No         [facility] PT Assessment - 09/16/17 1423      AROM   Left Shoulder Flexion  83 Degrees   109 dg with excessive scap. elevation    Left Shoulder ABduction  53 Degrees   84 dg      PROM   PROM Assessment Site  Shoulder;Wrist    Right/Left Shoulder  Left    Left Shoulder Flexion  140 Degrees    Left Shoulder ABduction  120 Degrees    Left Shoulder Internal Rotation  63 Degrees    Left Shoulder External Rotation  75 Degrees    Right/Left Wrist  Left    Left Wrist Extension  60 Degrees    Left Wrist Flexion  80 Degrees      Strength   Strength Assessment Site  Shoulder;Elbow;Wrist    Right/Left Shoulder  Right;Left    Right Shoulder Flexion  4+/5    Right Shoulder ABduction  4+/5    Left Shoulder Flexion  4/5    Left Shoulder ABduction  4-/5    Left Shoulder Internal Rotation  4/5    Left Shoulder External Rotation  4-/5    Right/Left Elbow  Right;Left    Right Elbow  Flexion  4+/5    Right Elbow Extension  4+/5    Left Elbow Flexion  4+/5    Left Elbow Extension  4+/5    Right/Left Wrist  Right;Left    Right Wrist Flexion  4+/5    Right Wrist Extension  4+/5    Left Wrist Flexion  4+/5    Left Wrist Extension  4+/5                   [facility] Adult PT Treatment/Exercise - 09/16/17 1515      Self-Care   Self-Care  Other Self-Care Comments    Other Self-Care Comments   Reviewed and updated HEP       Shoulder Exercises: Standing   External Rotation  10 reps;Left;Theraband;Strengthening    Theraband Level (Shoulder External Rotation)  Level 3 (Green)    Internal Rotation  Left;10 reps;Theraband;Strengthening    Theraband Level (Shoulder Internal Rotation)  Level 3 (Green)    Row  Both;15 reps;Theraband;Strengthening    Theraband Level (Shoulder Row)  Level 3 (Green)      Shoulder Exercises: IT sales professional  2 reps;20 seconds;Limitations    Corner Stretch Limitations  mid and low    Internal Rotation Stretch  1 rep   sleeper stretch x 30 sec    External Rotation Stretch  2 reps;30 seconds       Manual Therapy   Manual Therapy  Passive ROM;Soft tissue mobilization;Scapular mobilization    Manual therapy comments  prone, supine     Joint Mobilization  gentle grade III L shoulder mobs posterior, inferior in various angles of flexion and scaption     Soft tissue mobilization  STM to L posterior/inferior shoulder, L pecs, L UT    Myofascial Release  TPR to L Teres Minor/Infraspinatus - palpable tightness     Scapular Mobilization  medial scapular mobs     Passive ROM  PROM L shoulder all directions with gentle prolonged holds for improved ROM              PT Education - 09/16/17 1514    Education Details  HEP update    Person(s) Educated  Patient    Methods  Explanation;Demonstration;Verbal cues;Handout    Comprehension  Verbalized understanding;Returned demonstration;Verbal cues required;Need further instruction       PT Short Term Goals - 08/06/17 0853      PT SHORT TERM GOAL #1   Title  Patient to be independent with initial HEP.    Time  4    Period  Weeks    Status  Achieved        PT Long Term Goals - 08/10/17 1026      PT LONG TERM GOAL #1   Title  Patient to be independent with advanced HEP.    Time  8    Period  Weeks    Status  Partially Met   met for current      PT LONG TERM GOAL #2   Title  Patient to demonstrate L shoulder AROM/PROM [facility] without pain limiting.     Time  8    Period  Weeks    Status  On-going      PT LONG TERM GOAL #3   Title  Patient to demonstrate WFL L wrist AROM/PROM without pain limiting.    Status  On-going      PT LONG TERM GOAL #4   Title  Patient to demonstrate >=4+/5 L UE strength.  Time  8    Period  Weeks    Status  On-going      PT LONG TERM GOAL #5   Title  Patient to demonstrate overhead reaching with L UE to retrieve 5lb object from cabinet.     Time  8    Period  Weeks    Status  On-going      PT LONG TERM GOAL #6   Title  Patient to report comfortable use of walking sticks in B UEs when hiking  to return to recreational hiking hobby.     Time  8    Period  Weeks    Status  Achieved            Plan - 09/16/17 1522    Clinical Impression Statement  Danielle Arnold returning to therapy after nearly four weeks due to being in [place] due to mother passing away.  Pt. demonstrating mild improvement in L PROM and AROM and HEP updated today to focus on ROM.  Green TB issued to pt. for band row, IR, and ER.  Pt. tolerated all strengthening and ROM activities in session well today and supervising PT approving pt. to be scheduled at 1x/2 wks for HEP update.  Will continue to progress toward goals.      PT Treatment/Interventions  ADLs/Self Care Home Management;Cryotherapy;Electrical Stimulation;Moist Heat;Ultrasound;Functional mobility training;Therapeutic activities;Therapeutic exercise;Manual techniques;Patient/family education;Passive range of motion;Dry needling;Energy conservation;Splinting;Taping;Vasopneumatic Device    Consulted and Agree with Plan of Care  Patient       Patient will benefit from skilled therapeutic intervention in order to improve the following deficits and impairments:  Hypomobility, Decreased activity tolerance, Decreased strength, Impaired UE functional use, Pain, Decreased mobility, Decreased range of motion, Postural dysfunction, Impaired flexibility  Visit Diagnosis: Pain in left upper arm  Stiffness of left shoulder, not elsewhere classified  Pain in left wrist  Stiffness of left wrist, not elsewhere classified  Muscle weakness (generalized)     Problem List Patient Active Problem List   Diagnosis Date Noted  . Lateral epicondylitis, left elbow 02/09/2017  . Metatarsalgia, left foot 10/09/2016  . Left shoulder pain 10/09/2016  . Right patellofemoral syndrome 12/28/2014  . Osteoarthritis of left hip 08/18/2013  . Hyperlipidemia 12/31/2011  . Tinea 12/25/2011  . Constipation 12/25/2011  . Vaginal dryness 12/25/2011  . Left lumbar radiculitis  11/27/2011  . Sciatica 11/18/2011  . Plantar fasciitis 07/06/2011  . Heel spur 07/06/2011  . Left leg injury 02/10/2011  . Dermatitis 02/10/2011  . Anemia 11/21/2010  . Irritable bowel 11/21/2010  . FH: breast cancer in first degree relative 10/31/2010  . Anxiety   . Menopausal hot flushes     Bess Harvest, PTA 09/16/17 3:24 PM   [facility] [address]  [place], [place], [address] Phone: [phone]   Fax:  [phone]  Name: Danielle Arnold MRN: [id number] Date of Birth: 05-Mar-1960  PHYSICAL THERAPY DISCHARGE SUMMARY  Visits from Start of Care: 13  Current functional level related to goals / functional outcomes: Unable to assess; patient did not return after last visit    Remaining deficits: Unable to assess   Education / Equipment: HEP  Plan: Patient agrees to discharge.  Patient goals were not met. Patient is being discharged due to not returning since the last visit.  ?????     Janene Harvey, PT, DPT 10/28/17 9:35 AM

## 2017-09-23 ENCOUNTER — Telehealth: Payer: Self-pay | Admitting: Neurology

## 2017-09-23 NOTE — Telephone Encounter (Signed)
Pt requesting a call, stating she would like to discus the blood work that had been drawn. Please advise

## 2017-09-23 NOTE — Telephone Encounter (Signed)
Spoke with pt. She stated that insurance did not cover the B6 that was rechecked at her pcp office even though it was covered when it was checked by our office in February and the cost will be well over $100. RN advised that they would need to decide how they are going to code it. Tried to call [facility] in case we could be of assistance. LVM for [facility].

## 2017-09-24 NOTE — Telephone Encounter (Signed)
Pt aware that we have not received a return call from [facility]. She will continue to try to reach out to them. Pt appreciative.

## 2017-09-30 ENCOUNTER — Ambulatory Visit: Payer: Federal, State, Local not specified - PPO | Admitting: Physical Therapy

## 2017-10-28 DIAGNOSIS — K080 Exfoliation of teeth due to systemic causes: Secondary | ICD-10-CM | POA: Diagnosis not present

## 2018-06-16 DIAGNOSIS — K080 Exfoliation of teeth due to systemic causes: Secondary | ICD-10-CM | POA: Diagnosis not present

## 2018-09-14 DIAGNOSIS — L821 Other seborrheic keratosis: Secondary | ICD-10-CM | POA: Diagnosis not present

## 2018-09-14 DIAGNOSIS — D229 Melanocytic nevi, unspecified: Secondary | ICD-10-CM | POA: Diagnosis not present

## 2018-09-14 DIAGNOSIS — L814 Other melanin hyperpigmentation: Secondary | ICD-10-CM | POA: Diagnosis not present

## 2018-12-24 ENCOUNTER — Other Ambulatory Visit: Payer: Self-pay | Admitting: Internal Medicine

## 2018-12-24 DIAGNOSIS — Z1231 Encounter for screening mammogram for malignant neoplasm of breast: Secondary | ICD-10-CM

## 2019-01-20 DIAGNOSIS — K080 Exfoliation of teeth due to systemic causes: Secondary | ICD-10-CM | POA: Diagnosis not present

## 2019-07-15 ENCOUNTER — Telehealth: Payer: Self-pay | Admitting: Internal Medicine

## 2019-07-15 NOTE — Telephone Encounter (Signed)
Patient states her PCP has retired , and wants to know if [facility] can take her on as a new patient , Patient has a friend that sees [facility] . Please advise

## 2019-07-27 ENCOUNTER — Telehealth: Payer: Self-pay

## 2019-07-27 NOTE — Telephone Encounter (Signed)
New message    The patient voiced that [facility] had spoken with you in accepting Nefertiti as a new patient.    Please advise.

## 2019-07-28 NOTE — Telephone Encounter (Signed)
Yes, I will accept her 

## 2019-07-29 NOTE — Telephone Encounter (Signed)
F/u    The patient wanted to see if Dr. Quay Burow will accept her as a new patient.   Aware that MD is on vacation will return end of the month  Please advise

## 2019-08-15 ENCOUNTER — Encounter: Payer: Self-pay | Admitting: Obstetrics and Gynecology

## 2019-08-15 ENCOUNTER — Other Ambulatory Visit: Payer: Self-pay | Admitting: Obstetrics and Gynecology

## 2019-08-15 ENCOUNTER — Ambulatory Visit: Payer: Federal, State, Local not specified - PPO | Admitting: Obstetrics and Gynecology

## 2019-08-15 ENCOUNTER — Other Ambulatory Visit: Payer: Self-pay

## 2019-08-15 ENCOUNTER — Telehealth: Payer: Self-pay

## 2019-08-15 ENCOUNTER — Other Ambulatory Visit (HOSPITAL_COMMUNITY)
Admission: RE | Admit: 2019-08-15 | Discharge: 2019-08-15 | Disposition: A | Discharge status: Home / Self Care | Payer: Federal, State, Local not specified - PPO | Source: Ambulatory Visit | Attending: Obstetrics and Gynecology | Admitting: Obstetrics and Gynecology

## 2019-08-15 VITALS — BP 122/62 | HR 68 | Ht 66.0 in | Wt 117.0 lb

## 2019-08-15 DIAGNOSIS — Z124 Encounter for screening for malignant neoplasm of cervix: Secondary | ICD-10-CM

## 2019-08-15 DIAGNOSIS — Z01419 Encounter for gynecological examination (general) (routine) without abnormal findings: Secondary | ICD-10-CM

## 2019-08-15 DIAGNOSIS — N644 Mastodynia: Secondary | ICD-10-CM

## 2019-08-15 DIAGNOSIS — Z803 Family history of malignant neoplasm of breast: Secondary | ICD-10-CM | POA: Diagnosis not present

## 2019-08-15 NOTE — Progress Notes (Signed)
59 y.o. G0P0000 Single White or Caucasian Not Hispanic or Latino female here for right breast pain and an aex.  She c/o intermittent bilateral breast pain, R>L for the last 18 months. The pain is a 2/10 in severity, mostly present. The breast can be tender to touch. She drinks one green tea a day, no coffee or soda.  No vaginal bleeding. Not sexually active. Rare vasomotor symptoms.  No bowel or bladder c/o. She voids a lot, normal amounts, drinks a lot of water.      Patient's last menstrual period was 05/14/2010 (within years).          Sexually active: No.  The current method of family planning is post menopausal status.    Exercising: Yes.    Biking, Hiking, and waking  Smoker:  no  Health Maintenance: Pap:  07/05/15 WNL, 12/27/12 WNL History of abnormal Pap:  Yes in her 20's, Cone biopsy MMG:  06/25/15 density C Bu-rads 1 neg  BMD:   none Colonoscopy: 3-4 years ago WNL. FH of colon polyp, was advised to have f/u in 5 years, declines, will do 10 years.  TDaP:  11/21/10 Gardasil: NA   reports that she has never smoked. She has never used smokeless tobacco. She reports current alcohol use of about 1.0 standard drink of alcohol per week. She reports that she does not use drugs. 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   Past Medical History:  Diagnosis Date  . Abnormal Pap smear and cervical HPV (human papillomavirus) 1989   Cone biopsy  ????dysplasia  . Anxiety    mild on no meds  . Endometriosis   . Fatty liver   . Fibroid   . Infection    UTI  . Liver hemangioma   . Menopausal hot flushes   . Pure hypercholesterolemia   . Vitamin D insufficiency     Past Surgical History:  Procedure Laterality Date  . APPENDECTOMY  80  . CERVICAL CONE BIOPSY  89  . DILATION AND CURETTAGE OF UTERUS  3/11  . ear operation Bilateral 89 67 68   ear drum repair  . MYOMECTOMY  2000  . UTERINE FIBROID EMBOLIZATION  2003    Current Outpatient Medications   Medication Sig Dispense Refill  . Cholecalciferol (VITAMIN D3) 5000 units CAPS Take 1 capsule by mouth every other day.     . Cyanocobalamin (VITAMIN B-12 PO) Take 1,000 mg by mouth. Take 2-3 times weekly    . Ibuprofen-Famotidine (DUEXIS) 800-26.6 MG TABS Take as directed prn pain 90 tablet 6  . Multiple Vitamin (MULTIVITAMIN) tablet Take 1 tablet by mouth daily.     No current facility-administered medications for this visit.    Family History  Problem Relation Age of Onset  . Dementia Mother   . Cancer Sister        breast  premenopausal  . Cancer Maternal Grandmother        breast  . Sudden death Neg Hx   . Hypertension Neg Hx   . Heart attack Neg Hx   . Hyperlipidemia Neg Hx   Sister was 58 with breast cancer, now in her 62's. She doesn't know if she had genetic testing. She has 4 other sisters, none with breast cancer. MGM with breast cancer in her 18's.   Review of Systems  Genitourinary:       Right breast pain Vaginal dryness   All other systems reviewed and are negative.   Exam:  BP (!) 122/62   Pulse 68   Ht 5\' 6"  (1.676 m)   Wt 117 lb (53.1 kg)   LMP 05/14/2010 (Within Years)   SpO2 100%   BMI 18.88 kg/m   Weight change: @WEIGHTCHANGE @ Height:   Height: 5\' 6"  (167.6 cm)  Ht Readings from Last 3 Encounters:  08/15/19 5\' 6"  (1.676 m)  05/29/17 5\' 6"  (1.676 m)  05/19/17 5\' 6"  (1.676 m)    General appearance: alert, cooperative and appears stated age Head: Normocephalic, without obvious abnormality, atraumatic Neck: no adenopathy, supple, symmetrical, trachea midline and thyroid normal to inspection and palpation Lungs: clear to auscultation bilaterally Cardiovascular: regular rate and rhythm Breasts: normal appearance, no masses or tenderness. Appears to have some crusting on bilateral nipples. She states they have always been like this and denies any nipple discharge.  Abdomen: soft, non-tender; non distended,  no masses,  no organomegaly Extremities:  extremities normal, atraumatic, no cyanosis or edema Skin: Skin color, texture, turgor normal. No rashes or lesions Lymph nodes: Cervical, supraclavicular, and axillary nodes normal. No abnormal inguinal nodes palpated Neurologic: Grossly normal   Pelvic: External genitalia:  no lesions              Urethra:  normal appearing urethra with no masses, tenderness or lesions              Bartholins and Skenes: normal                 Vagina: normal appearing vagina with normal color and discharge, no lesions              Cervix: no lesions               Bimanual Exam:  Uterus:  normal size, contour, position, consistency, mobility, non-tender              Adnexa: no mass, fullness, tenderness               Rectovaginal: Confirms               Anus:  normal sphincter tone, no lesions  Terence Lux chaperoned for the exam.  A:  Well Woman with normal exam  FH of breast cancer, discussed the option of genetic counseling. She will consider.  Breast pain  P:   Pap with hpv testing  Diagnostic breast imaging  Information on breast pain given and discussed.  Discussed breast self exam  Discussed calcium and vit D intake  Labs tomorrow with her primary  Colonoscopy UTD

## 2019-08-15 NOTE — Patient Instructions (Addendum)
Blood work was ordered.    All other Health Maintenance issues reviewed.   All recommended immunizations and age-appropriate screenings are up-to-date or discussed.  No immunization administered today.   Medications reviewed and updated.  Changes include :   none     Please followup in 1 year    Health Maintenance, Female Adopting a healthy lifestyle and getting preventive care are important in promoting health and wellness. Ask your health care provider about:  The right schedule for you to have regular tests and exams.  Things you can do on your own to prevent diseases and keep yourself healthy. What should I know about diet, weight, and exercise? Eat a healthy diet   Eat a diet that includes plenty of vegetables, fruits, low-fat dairy products, and lean protein.  Do not eat a lot of foods that are high in solid fats, added sugars, or sodium. Maintain a healthy weight Body mass index (BMI) is used to identify weight problems. It estimates body fat based on height and weight. Your health care provider can help determine your BMI and help you achieve or maintain a healthy weight. Get regular exercise Get regular exercise. This is one of the most important things you can do for your health. Most adults should:  Exercise for at least 150 minutes each week. The exercise should increase your heart rate and make you sweat (moderate-intensity exercise).  Do strengthening exercises at least twice a week. This is in addition to the moderate-intensity exercise.  Spend less time sitting. Even light physical activity can be beneficial. Watch cholesterol and blood lipids Have your blood tested for lipids and cholesterol at 61 years of age, then have this test every 5 years. Have your cholesterol levels checked more often if:  Your lipid or cholesterol levels are high.  You are older than 61 years of age.  You are at high risk for heart disease. What should I know about cancer  screening? Depending on your health history and family history, you may need to have cancer screening at various ages. This may include screening for:  Breast cancer.  Cervical cancer.  Colorectal cancer.  Skin cancer.  Lung cancer. What should I know about heart disease, diabetes, and high blood pressure? Blood pressure and heart disease  High blood pressure causes heart disease and increases the risk of stroke. This is more likely to develop in people who have high blood pressure readings, are of African descent, or are overweight.  Have your blood pressure checked: ? Every 3-5 years if you are 18-39 years of age. ? Every year if you are 40 years old or older. Diabetes Have regular diabetes screenings. This checks your fasting blood sugar level. Have the screening done:  Once every three years after age 40 if you are at a normal weight and have a low risk for diabetes.  More often and at a younger age if you are overweight or have a high risk for diabetes. What should I know about preventing infection? Hepatitis B If you have a higher risk for hepatitis B, you should be screened for this virus. Talk with your health care provider to find out if you are at risk for hepatitis B infection. Hepatitis C Testing is recommended for:  Everyone born from 1945 through 1965.  Anyone with known risk factors for hepatitis C. Sexually transmitted infections (STIs)  Get screened for STIs, including gonorrhea and chlamydia, if: ? You are sexually active and are younger than 61 years   of age. ? You are older than 61 years of age and your health care provider tells you that you are at risk for this type of infection. ? Your sexual activity has changed since you were last screened, and you are at increased risk for chlamydia or gonorrhea. Ask your health care provider if you are at risk.  Ask your health care provider about whether you are at high risk for HIV. Your health care provider may  recommend a prescription medicine to help prevent HIV infection. If you choose to take medicine to prevent HIV, you should first get tested for HIV. You should then be tested every 3 months for as long as you are taking the medicine. Pregnancy  If you are about to stop having your period (premenopausal) and you may become pregnant, seek counseling before you get pregnant.  Take 400 to 800 micrograms (mcg) of folic acid every day if you become pregnant.  Ask for birth control (contraception) if you want to prevent pregnancy. Osteoporosis and menopause Osteoporosis is a disease in which the bones lose minerals and strength with aging. This can result in bone fractures. If you are 65 years old or older, or if you are at risk for osteoporosis and fractures, ask your health care provider if you should:  Be screened for bone loss.  Take a calcium or vitamin D supplement to lower your risk of fractures.  Be given hormone replacement therapy (HRT) to treat symptoms of menopause. Follow these instructions at home: Lifestyle  Do not use any products that contain nicotine or tobacco, such as cigarettes, e-cigarettes, and chewing tobacco. If you need help quitting, ask your health care provider.  Do not use street drugs.  Do not share needles.  Ask your health care provider for help if you need support or information about quitting drugs. Alcohol use  Do not drink alcohol if: ? Your health care provider tells you not to drink. ? You are pregnant, may be pregnant, or are planning to become pregnant.  If you drink alcohol: ? Limit how much you use to 0-1 drink a day. ? Limit intake if you are breastfeeding.  Be aware of how much alcohol is in your drink. In the [place] one drink equals one 12 oz bottle of beer (355 mL), one 5 oz glass of wine (148 mL), or one 1 oz glass of hard liquor (44 mL). General instructions  Schedule regular health, dental, and eye exams.  Stay current with your  vaccines.  Tell your health care provider if: ? You often feel depressed. ? You have ever been abused or do not feel safe at home. Summary  Adopting a healthy lifestyle and getting preventive care are important in promoting health and wellness.  Follow your health care provider's instructions about healthy diet, exercising, and getting tested or screened for diseases.  Follow your health care provider's instructions on monitoring your cholesterol and blood pressure. This information is not intended to replace advice given to you by your health care provider. Make sure you discuss any questions you have with your health care provider. Document Revised: 12/23/2017 Document Reviewed: 12/23/2017 Elsevier Patient Education  2020 [employer]  

## 2019-08-15 NOTE — Telephone Encounter (Signed)
Spoke with pt. Pt given dx [facility] appt. Pt states is out of town that week. Pt to call and reschedule appt to fit schedule. Pt agreeable and verbalized understanding. Pt given number to TBC.   Routing to Dr Talbert Nan for review.  Encounter closed.

## 2019-08-15 NOTE — Patient Instructions (Addendum)
EXERCISE AND DIET:  We recommended that you start or continue a regular exercise program for good health. Regular exercise means any activity that makes your heart beat faster and makes you sweat.  We recommend exercising at least 30 minutes per day at least 3 days a week, preferably 4 or 5.  We also recommend a diet low in fat and sugar.  Inactivity, poor dietary choices and obesity can cause diabetes, heart attack, stroke, and kidney damage, among others.    ALCOHOL AND SMOKING:  Women should limit their alcohol intake to no more than 7 drinks/beers/glasses of wine (combined, not each!) per week. Moderation of alcohol intake to this level decreases your risk of breast cancer and liver damage. And of course, no recreational drugs are part of a healthy lifestyle.  And absolutely no smoking or even second hand smoke. Most people know smoking can cause heart and lung diseases, but did you know it also contributes to weakening of your bones? Aging of your skin?  Yellowing of your teeth and nails?  CALCIUM AND VITAMIN D:  Adequate intake of calcium and Vitamin D are recommended.  The recommendations for exact amounts of these supplements seem to change often, but generally speaking 1,200 mg of calcium (between diet and supplement) and 800 units of Vitamin D per day seems prudent. Certain women may benefit from higher intake of Vitamin D.  If you are among these women, your doctor will have told you during your visit.    PAP SMEARS:  Pap smears, to check for cervical cancer or precancers,  have traditionally been done yearly, although recent scientific advances have shown that most women can have pap smears less often.  However, every woman still should have a physical exam from her gynecologist every year. It will include a breast check, inspection of the vulva and vagina to check for abnormal growths or skin changes, a visual exam of the cervix, and then an exam to evaluate the size and shape of the uterus and  ovaries.  And after 61 years of age, a rectal exam is indicated to check for rectal cancers. We will also provide age appropriate advice regarding health maintenance, like when you should have certain vaccines, screening for sexually transmitted diseases, bone density testing, colonoscopy, mammograms, etc.   MAMMOGRAMS:  All women over 40 years old should have a yearly mammogram. Many facilities now offer a "3D" mammogram, which may cost around $50 extra out of pocket. If possible,  we recommend you accept the option to have the 3D mammogram performed.  It both reduces the number of women who will be called back for extra views which then turn out to be normal, and it is better than the routine mammogram at detecting truly abnormal areas.    COLON CANCER SCREENING: Now recommend starting at age 45. At this time colonoscopy is not covered for routine screening until 50. There are take home tests that can be done between 45-49.   COLONOSCOPY:  Colonoscopy to screen for colon cancer is recommended for all women at age 50.  We know, you hate the idea of the prep.  We agree, BUT, having colon cancer and not knowing it is worse!!  Colon cancer so often starts as a polyp that can be seen and removed at colonscopy, which can quite literally save your life!  And if your first colonoscopy is normal and you have no family history of colon cancer, most women don't have to have it again for   10 years.  Once every ten years, you can do something that may end up saving your life, right?  We will be happy to help you get it scheduled when you are ready.  Be sure to check your insurance coverage so you understand how much it will cost.  It may be covered as a preventative service at no cost, but you should check your particular policy.      Breast Self-Awareness Breast self-awareness means being familiar with how your breasts look and feel. It involves checking your breasts regularly and reporting any changes to your  health care provider. Practicing breast self-awareness is important. A change in your breasts can be a sign of a serious medical problem. Being familiar with how your breasts look and feel allows you to find any problems early, when treatment is more likely to be successful. All women should practice breast self-awareness, including women who have had breast implants. How to do a breast self-exam One way to learn what is normal for your breasts and whether your breasts are changing is to do a breast self-exam. To do a breast self-exam: Look for Changes  1. Remove all the clothing above your waist. 2. Stand in front of a mirror in a room with good lighting. 3. Put your hands on your hips. 4. Push your hands firmly downward. 5. Compare your breasts in the mirror. Look for differences between them (asymmetry), such as: ? Differences in shape. ? Differences in size. ? Puckers, dips, and bumps in one breast and not the other. 6. Look at each breast for changes in your skin, such as: ? Redness. ? Scaly areas. 7. Look for changes in your nipples, such as: ? Discharge. ? Bleeding. ? Dimpling. ? Redness. ? A change in position. Feel for Changes Carefully feel your breasts for lumps and changes. It is best to do this while lying on your back on the floor and again while sitting or standing in the shower or tub with soapy water on your skin. Feel each breast in the following way:  Place the arm on the side of the breast you are examining above your head.  Feel your breast with the other hand.  Start in the nipple area and make  inch (2 cm) overlapping circles to feel your breast. Use the pads of your three middle fingers to do this. Apply light pressure, then medium pressure, then firm pressure. The light pressure will allow you to feel the tissue closest to the skin. The medium pressure will allow you to feel the tissue that is a little deeper. The firm pressure will allow you to feel the tissue  close to the ribs.  Continue the overlapping circles, moving downward over the breast until you feel your ribs below your breast.  Move one finger-width toward the center of the body. Continue to use the  inch (2 cm) overlapping circles to feel your breast as you move slowly up toward your collarbone.  Continue the up and down exam using all three pressures until you reach your armpit.  Write Down What You Find  Write down what is normal for each breast and any changes that you find. Keep a written record with breast changes or normal findings for each breast. By writing this information down, you do not need to depend only on memory for size, tenderness, or location. Write down where you are in your menstrual cycle, if you are still menstruating. If you are having trouble noticing differences   in your breasts, do not get discouraged. With time you will become more familiar with the variations in your breasts and more comfortable with the exam. How often should I examine my breasts? Examine your breasts every month. If you are breastfeeding, the best time to examine your breasts is after a feeding or after using a breast pump. If you menstruate, the best time to examine your breasts is 5-7 days after your period is over. During your period, your breasts are lumpier, and it may be more difficult to notice changes. When should I see my health care provider? See your health care provider if you notice:  A change in shape or size of your breasts or nipples.  A change in the skin of your breast or nipples, such as a reddened or scaly area.  Unusual discharge from your nipples.  A lump or thick area that was not there before.  Pain in your breasts.  Anything that concerns you.  Breast Tenderness Breast tenderness is a common problem for women of all ages, but may also occur in men. Breast tenderness may range from mild discomfort to severe pain. In women, the pain usually comes and goes with  the menstrual cycle, but it can also be constant. Breast tenderness has many possible causes, including hormone changes, infections, and taking certain medicines. You may have tests, such as a mammogram or an ultrasound, to check for any unusual findings. Having breast tenderness usually does not mean that you have breast cancer. Follow these instructions at home: Managing pain and discomfort   If directed, put ice to the painful area. To do this: ? Put ice in a plastic bag. ? Place a towel between your skin and the bag. ? Leave the ice on for 20 minutes, 2-3 times a day.  Wear a supportive bra, especially during exercise. You may also want to wear a supportive bra while sleeping if your breasts are very tender. Medicines  Take over-the-counter and prescription medicines only as told by your health care provider. If the cause of your pain is infection, you may be prescribed an antibiotic medicine.  If you were prescribed an antibiotic, take it as told by your health care provider. Do not stop taking the antibiotic even if you start to feel better. Eating and drinking  Your health care provider may recommend that you lessen the amount of fat in your diet. You can do this by: ? Limiting fried foods. ? Cooking foods using methods such as baking, boiling, grilling, and broiling.  Decrease the amount of caffeine in your diet. Instead, drink more water and choose caffeine-free drinks. General instructions   Keep a log of the days and times when your breasts are most tender.  Ask your health care provider how to do breast exams at home. This will help you notice if you have an unusual growth or lump.  Keep all follow-up visits as told by your health care provider. This is important. Contact a health care provider if:  Any part of your breast is hard, red, and hot to the touch. This may be a sign of infection.  You are a woman and: ? Not breastfeeding and you have fluid, especially blood  or pus, coming out of your nipples. ? Have a new or painful lump in your breast that remains after your menstrual period ends.  You have a fever.  Your pain does not improve or it gets worse.  Your pain is interfering with your daily   activities. Summary  Breast tenderness may range from mild discomfort to severe pain.  Breast tenderness has many possible causes, including hormone changes, infections, and taking certain medicines.  It can be treated with ice, wearing a supportive bra, and medicines.  Make changes to your diet if told to by your health care provider. This information is not intended to replace advice given to you by your health care provider. Make sure you discuss any questions you have with your health care provider. Document Revised: 05/24/2018 Document Reviewed: 05/24/2018 Elsevier Patient Education  Waldo can try evening primrose oil or vit e for breast pain.

## 2019-08-15 NOTE — Addendum Note (Signed)
Addended by: Dorothy Spark on: 08/15/2019 05:07 PM   Modules accepted: Orders

## 2019-08-15 NOTE — Telephone Encounter (Signed)
-----   Message from Salvadore Dom, MD sent at 08/15/2019  9:59 AM EDT ----- Please set her up for bilateral diagnostic breast imaging. Bilateral pain (not the same spots or frequency).  Thanks, Sharee Pimple

## 2019-08-15 NOTE — Telephone Encounter (Signed)
Patient returned call

## 2019-08-15 NOTE — Progress Notes (Signed)
Subjective:    Patient ID: Danielle Arnold, female    DOB: 01-Aug-1960, 59 y.o.   MRN: [id number]  HPI  She is here to establish with a new pcp.  She is here for a physical exam.   She has no concerns - her PCP retired a couple of years ago and she has not seen anyone.    She is very healthy and active.   Medications and allergies reviewed with patient and updated if appropriate.  Patient Active Problem List   Diagnosis Date Noted  . Lateral epicondylitis, left elbow 02/09/2017  . Metatarsalgia, left foot 10/09/2016  . Left shoulder pain 10/09/2016  . Right patellofemoral syndrome 12/28/2014  . Osteoarthritis of left hip 08/18/2013  . Hyperlipidemia 12/31/2011  . Tinea 12/25/2011  . Constipation 12/25/2011  . Vaginal dryness 12/25/2011  . Left lumbar radiculitis 11/27/2011  . Sciatica 11/18/2011  . Plantar fasciitis 07/06/2011  . Heel spur 07/06/2011  . Left leg injury 02/10/2011  . Dermatitis 02/10/2011  . Anemia 11/21/2010  . Irritable bowel 11/21/2010  . FH: breast cancer in first degree relative 10/31/2010  . Anxiety   . Menopausal hot flushes     Current Outpatient Medications on File Prior to Visit  Medication Sig Dispense Refill  . Cholecalciferol (VITAMIN D3) 5000 units CAPS Take 1 capsule by mouth every other day.     . Cyanocobalamin (VITAMIN B-12 PO) Take 1,000 mg by mouth. Take 2-3 times weekly    . Multiple Vitamin (MULTIVITAMIN) tablet Take 1 tablet by mouth daily.     No current facility-administered medications on file prior to visit.    Past Medical History:  Diagnosis Date  . Abnormal Pap smear and cervical HPV (human papillomavirus) 1989   Cone biopsy  ????dysplasia  . Anxiety    mild on no meds  . Endometriosis   . Fatty liver   . Fibroid   . Infection    UTI  . Liver hemangioma   . Menopausal hot flushes   . Pure hypercholesterolemia   . Vitamin D insufficiency     Past Surgical History:  Procedure Laterality Date  .  APPENDECTOMY  80  . CERVICAL CONE BIOPSY  89  . DILATION AND CURETTAGE OF UTERUS  3/11  . ear operation Bilateral 89 67 68   ear drum repair  . MYOMECTOMY  2000  . UTERINE FIBROID EMBOLIZATION  2003    Social History   Socioeconomic History  . Marital status: Single    Spouse name: Not on file  . Number of children: 0  . Years of education: Not on file  . Highest education level: Some college, no degree  Occupational History  . Not on file  Tobacco Use  . Smoking status: Never Smoker  . Smokeless tobacco: Never Used  Vaping Use  . Vaping Use: Never used  Substance and Sexual Activity  . Alcohol use: Yes    Alcohol/week: 1.0 standard drink    Types: 1 Glasses of wine per week  . Drug use: No  . Sexual activity: Never    Partners: Male  Other Topics Concern  . Not on file  Social History Narrative   Lives at home alone   Left handed   Drinks 1 cup of green tea daily   Social Determinants of Health   Financial Resource Strain:   . Difficulty of Paying Living Expenses:   Food Insecurity:   . Worried About Charity fundraiser in the  Last Year:   . [place] in the Last Year:   Transportation Needs:   . Film/video editor (Medical):   Marland Kitchen Lack of Transportation (Non-Medical):   Physical Activity:   . Days of Exercise per Week:   . Minutes of Exercise per Session:   Stress:   . Feeling of Stress :   Social Connections:   . Frequency of Communication with Friends and Family:   . Frequency of Social Gatherings with Friends and Family:   . Attends Religious Services:   . Active Member of Clubs or Organizations:   . Attends Archivist Meetings:   Marland Kitchen Marital Status:     Family History  Problem Relation Age of Onset  . Dementia Mother   . Cancer Sister        breast  premenopausal  . Cancer Maternal Grandmother        breast  . Sudden death Neg Hx   . Hypertension Neg Hx   . Heart attack Neg Hx   . Hyperlipidemia Neg Hx     Review of  Systems  Constitutional: Negative for chills, fatigue and fever.  HENT: Negative for postnasal drip.   Eyes: Negative for visual disturbance.  Respiratory: Positive for cough (dry ). Negative for shortness of breath and wheezing.   Cardiovascular: Negative for chest pain, palpitations and leg swelling.  Gastrointestinal: Negative for abdominal pain, blood in stool, constipation, diarrhea and nausea.       No gerd  Genitourinary: Negative for dysuria and hematuria.  Musculoskeletal: Negative for arthralgias and back pain.  Skin: Negative for color change and rash.  Neurological: Positive for light-headedness. Negative for dizziness, numbness and headaches.  Psychiatric/Behavioral: Positive for sleep disturbance. Negative for dysphoric mood. The patient is not nervous/anxious.        Objective:   Vitals:   08/16/19 0754  BP: 118/78  Pulse: (!) 54  Temp: 98 F (36.7 C)  SpO2: 98%   Filed Weights   08/16/19 0754  Weight: 116 lb (52.6 kg)   Body mass index is 18.72 kg/m.  BP Readings from Last 3 Encounters:  08/16/19 118/78  08/15/19 (!) 122/62  05/19/17 123/74    Wt Readings from Last 3 Encounters:  08/16/19 116 lb (52.6 kg)  08/15/19 117 lb (53.1 kg)  05/29/17 125 lb (56.7 kg)     Physical Exam Constitutional: She appears well-developed and well-nourished. No distress.  HENT:  Head: Normocephalic and atraumatic.  Right Ear: External ear normal. Normal ear canal and TM Left Ear: External ear normal.  Normal ear canal and TM Mouth/Throat: Oropharynx is clear and moist.  Eyes: Conjunctivae and EOM are normal.  Neck: Neck supple. No tracheal deviation present. No thyromegaly present.  No carotid bruit  Cardiovascular: Normal rate, regular rhythm and normal heart sounds.   No murmur heard.  No edema. Pulmonary/Chest: Effort normal and breath sounds normal. No respiratory distress. She has no wheezes. She has no rales.  Breast: deferred   Abdominal: Soft. She  exhibits no distension. There is no tenderness.  Lymphadenopathy: She has no cervical adenopathy.  Skin: Skin is warm and dry. She is not diaphoretic.  Psychiatric: She has a normal mood and affect. Her behavior is normal.        Assessment & Plan:   Physical exam: Screening blood work    ordered Immunizations  covid and shingrix discussed Colonoscopy  Done 3-4 years ago - [facility]  Scheduled 08/30/19 - diagnostic for  breast pain Gyn  Dr Talbert Nan - up to date Eye exams  Up to date  Exercise   Hiking, biking Weight  normal Substance abuse  none Sees derm annually  See Problem List for Assessment and Plan of chronic medical problems.   This visit occurred during the SARS-CoV-2 public health emergency.  Safety protocols were in place, including screening questions prior to the visit, additional usage of staff PPE, and extensive cleaning of exam room while observing appropriate contact time as indicated for disinfecting solutions.

## 2019-08-15 NOTE — Telephone Encounter (Signed)
Call placed to [facility] for dx bilateral MMG. Pt scheduled for 08/30/19 at 840 am.  Call placed to pt. Left message to return call to triage RN.   Orders placed by Benjamine Mola at [facility] for cosign to Dr Talbert Nan.

## 2019-08-16 ENCOUNTER — Ambulatory Visit: Payer: Federal, State, Local not specified - PPO | Admitting: Internal Medicine

## 2019-08-16 ENCOUNTER — Encounter: Payer: Self-pay | Admitting: Internal Medicine

## 2019-08-16 VITALS — BP 118/78 | HR 54 | Temp 98.0°F | Ht 66.0 in | Wt 116.0 lb

## 2019-08-16 DIAGNOSIS — E559 Vitamin D deficiency, unspecified: Secondary | ICD-10-CM | POA: Diagnosis not present

## 2019-08-16 DIAGNOSIS — Z0000 Encounter for general adult medical examination without abnormal findings: Secondary | ICD-10-CM | POA: Diagnosis not present

## 2019-08-16 DIAGNOSIS — E538 Deficiency of other specified B group vitamins: Secondary | ICD-10-CM

## 2019-08-16 DIAGNOSIS — E782 Mixed hyperlipidemia: Secondary | ICD-10-CM | POA: Diagnosis not present

## 2019-08-16 LAB — COMPREHENSIVE METABOLIC PANEL
AG Ratio: 1.7 (calc) (ref 1.0–2.5)
ALT: 15 U/L (ref 6–29)
AST: 21 U/L (ref 10–35)
Albumin: 4.3 g/dL (ref 3.6–5.1)
Alkaline phosphatase (APISO): 83 U/L (ref 37–153)
BUN: 9 mg/dL (ref 7–25)
CO2: 28 mmol/L (ref 20–32)
Calcium: 9.6 mg/dL (ref 8.6–10.4)
Chloride: 100 mmol/L (ref 98–110)
Creat: 0.8 mg/dL (ref 0.50–0.99)
Globulin: 2.6 g/dL (calc) (ref 1.9–3.7)
Glucose, Bld: 89 mg/dL (ref 65–99)
Potassium: 5.1 mmol/L (ref 3.5–5.3)
Sodium: 134 mmol/L — ABNORMAL LOW (ref 135–146)
Total Bilirubin: 0.8 mg/dL (ref 0.2–1.2)
Total Protein: 6.9 g/dL (ref 6.1–8.1)

## 2019-08-16 LAB — LIPID PANEL
Cholesterol: 161 mg/dL (ref <200)
HDL: 66 mg/dL (ref >=50)
LDL Cholesterol (Calc): 82 mg/dL (calc)
Non-HDL Cholesterol (Calc): 95 mg/dL (calc) (ref <130)
Total CHOL/HDL Ratio: 2.4 (calc) (ref <5.0)
Triglycerides: 54 mg/dL (ref <150)

## 2019-08-16 LAB — CBC WITH DIFFERENTIAL/PLATELET
Absolute Monocytes: 351 cells/uL (ref 200–950)
Basophils Absolute: 39 cells/uL (ref 0–200)
Basophils Relative: 1 %
Eosinophils Absolute: 20 cells/uL (ref 15–500)
Eosinophils Relative: 0.5 %
HCT: 40.7 % (ref 35.0–45.0)
Hemoglobin: 13.2 g/dL (ref 11.7–15.5)
Lymphs Abs: 944 cells/uL (ref 850–3900)
MCH: 29.7 pg (ref 27.0–33.0)
MCHC: 32.4 g/dL (ref 32.0–36.0)
MCV: 91.5 fL (ref 80.0–100.0)
MPV: 10.6 fL (ref 7.5–12.5)
Monocytes Relative: 9 %
Neutro Abs: 2547 cells/uL (ref 1500–7800)
Neutrophils Relative %: 65.3 %
Platelets: 284 10*3/uL (ref 140–400)
RBC: 4.45 10*6/uL (ref 3.80–5.10)
RDW: 12.3 % (ref 11.0–15.0)
Total Lymphocyte: 24.2 %
WBC: 3.9 10*3/uL (ref 3.8–10.8)

## 2019-08-16 LAB — TSH: TSH: 3.15 mIU/L (ref 0.40–4.50)

## 2019-08-16 LAB — VITAMIN B12: Vitamin B-12: 453 pg/mL (ref 200–1100)

## 2019-08-16 LAB — VITAMIN D 25 HYDROXY (VIT D DEFICIENCY, FRACTURES): Vit D, 25-Hydroxy: 46 ng/mL (ref 30–100)

## 2019-08-16 NOTE — Assessment & Plan Note (Signed)
Chronic Taking vitamin d daily Check level 

## 2019-08-16 NOTE — Assessment & Plan Note (Signed)
Chronic Taking vitamin B12 daily Check level

## 2019-08-18 ENCOUNTER — Telehealth: Payer: Self-pay

## 2019-08-18 LAB — CYTOLOGY - PAP
Comment: NEGATIVE
Diagnosis: NEGATIVE
High risk HPV: NEGATIVE

## 2019-08-18 NOTE — Telephone Encounter (Signed)
New message    The patient is asking for recent labs to be mail to the home address.  Wants to discuss labs results have questions.

## 2019-08-18 NOTE — Telephone Encounter (Signed)
Called pt she states she saw were her sodium level was a little low, and it happen before. She wanted to know what she could do to raise her levels. Inform pt to eat a little salt, or drinks that are high in sodium. Mailed copy of labs to pt address.Marland KitchenJohny Chess

## 2019-08-30 ENCOUNTER — Other Ambulatory Visit: Payer: Federal, State, Local not specified - PPO

## 2019-09-05 ENCOUNTER — Other Ambulatory Visit: Payer: Federal, State, Local not specified - PPO

## 2019-09-27 ENCOUNTER — Ambulatory Visit
Admission: RE | Admit: 2019-09-27 | Discharge: 2019-09-27 | Disposition: A | Discharge status: Home / Self Care | Payer: Federal, State, Local not specified - PPO | Source: Ambulatory Visit | Attending: Obstetrics and Gynecology | Admitting: Obstetrics and Gynecology

## 2019-09-27 ENCOUNTER — Ambulatory Visit: Payer: Federal, State, Local not specified - PPO

## 2019-09-27 ENCOUNTER — Other Ambulatory Visit: Payer: Self-pay

## 2019-09-27 DIAGNOSIS — N644 Mastodynia: Secondary | ICD-10-CM

## 2019-09-27 DIAGNOSIS — R922 Inconclusive mammogram: Secondary | ICD-10-CM | POA: Diagnosis not present

## 2019-09-27 DIAGNOSIS — N6489 Other specified disorders of breast: Secondary | ICD-10-CM | POA: Diagnosis not present

## 2019-10-12 DIAGNOSIS — D2262 Melanocytic nevi of left upper limb, including shoulder: Secondary | ICD-10-CM | POA: Diagnosis not present

## 2019-10-12 DIAGNOSIS — L821 Other seborrheic keratosis: Secondary | ICD-10-CM | POA: Diagnosis not present

## 2019-10-12 DIAGNOSIS — D239 Other benign neoplasm of skin, unspecified: Secondary | ICD-10-CM | POA: Diagnosis not present

## 2019-10-12 DIAGNOSIS — D2261 Melanocytic nevi of right upper limb, including shoulder: Secondary | ICD-10-CM | POA: Diagnosis not present

## 2019-11-09 ENCOUNTER — Encounter: Payer: Federal, State, Local not specified - PPO | Admitting: Obstetrics and Gynecology

## 2019-11-16 NOTE — Progress Notes (Signed)
Subjective:    Patient ID: Danielle Arnold, female    DOB: 03-22-1960, 59 y.o.   MRN: [id number]  HPI The patient is here for an acute visit.  Fall last Wednesday-she was hiking and stepped in a hole and fell.  She had her water bottle in the left chest and fell onto the water bottle and that is the area that hurts.  Her pain got worse the beginning of this week, but has since then gotten better.  She denies any shortness of breath, cough, wheeze or fevers.  She has increased pain with movement and deep breaths.  She wanted to make sure she did not do any major injuries.  She has been taking Tylenol for the pain.   Medications and allergies reviewed with patient and updated if appropriate.  Patient Active Problem List   Diagnosis Date Noted  . Vitamin D deficiency 08/16/2019  . Vitamin B12 deficiency 08/16/2019  . Metatarsalgia, left foot 10/09/2016  . Right patellofemoral syndrome 12/28/2014  . Osteoarthritis of left hip 08/18/2013  . Hyperlipidemia 12/31/2011  . Vaginal dryness 12/25/2011  . Left lumbar radiculitis 11/27/2011  . Sciatica 11/18/2011  . Plantar fasciitis 07/06/2011  . Heel spur 07/06/2011  . Irritable bowel 11/21/2010  . FH: breast cancer in first degree relative 10/31/2010  . Anxiety   . Menopausal hot flushes     Current Outpatient Medications on File Prior to Visit  Medication Sig Dispense Refill  . Cholecalciferol (VITAMIN D3) 5000 units CAPS Take 1 capsule by mouth every other day.     . Cyanocobalamin (VITAMIN B-12 PO) Take 1,000 mg by mouth. Take 2-3 times weekly    . Multiple Vitamin (MULTIVITAMIN) tablet Take 1 tablet by mouth daily.     No current facility-administered medications on file prior to visit.    Past Medical History:  Diagnosis Date  . Abnormal Pap smear and cervical HPV (human papillomavirus) 1989   Cone biopsy  ????dysplasia  . Anxiety    mild on no meds  . Endometriosis   . Fatty liver   . Fibroid   . Infection    UTI   . Liver hemangioma   . Menopausal hot flushes   . Pure hypercholesterolemia   . Vitamin D insufficiency     Past Surgical History:  Procedure Laterality Date  . APPENDECTOMY  80  . CERVICAL CONE BIOPSY  89  . DILATION AND CURETTAGE OF UTERUS  3/11  . ear operation Bilateral 89 67 68   ear drum repair  . MYOMECTOMY  2000  . UTERINE FIBROID EMBOLIZATION  2003    Social History   Socioeconomic History  . Marital status: Single    Spouse name: Not on file  . Number of children: 0  . Years of education: Not on file  . Highest education level: Some college, no degree  Occupational History  . Not on file  Tobacco Use  . Smoking status: Never Smoker  . Smokeless tobacco: Never Used  Vaping Use  . Vaping Use: Never used  Substance and Sexual Activity  . Alcohol use: Yes    Alcohol/week: 1.0 standard drink    Types: 1 Glasses of wine per week  . Drug use: No  . Sexual activity: Never    Partners: Male  Other Topics Concern  . Not on file  Social History Narrative   Lives at home alone   Left handed   Drinks 1 cup of green tea daily  Social Determinants of Health   Financial Resource Strain:   . Difficulty of Paying Living Expenses: Not on file  Food Insecurity:   . Worried About Charity fundraiser in the Last Year: Not on file  . Ran Out of Food in the Last Year: Not on file  Transportation Needs:   . Lack of Transportation (Medical): Not on file  . Lack of Transportation (Non-Medical): Not on file  Physical Activity:   . Days of Exercise per Week: Not on file  . Minutes of Exercise per Session: Not on file  Stress:   . Feeling of Stress : Not on file  Social Connections:   . Frequency of Communication with Friends and Family: Not on file  . Frequency of Social Gatherings with Friends and Family: Not on file  . Attends Religious Services: Not on file  . Active Member of Clubs or Organizations: Not on file  . Attends Archivist Meetings: Not on  file  . Marital Status: Not on file    Family History  Problem Relation Age of Onset  . Dementia Mother   . Cancer Sister        breast  premenopausal  . Breast cancer Sister 58  . Cancer Maternal Grandmother        breast  . Sudden death Neg Hx   . Hypertension Neg Hx   . Heart attack Neg Hx   . Hyperlipidemia Neg Hx     Review of Systems  Constitutional: Negative for chills and fever.  Respiratory: Negative for cough, shortness of breath and wheezing.   Cardiovascular: Positive for chest pain.       Objective:   Vitals:   11/17/19 0931  BP: 116/70  Pulse: (!) 59  Temp: 98 F (36.7 C)  SpO2: 98%   BP Readings from Last 3 Encounters:  11/17/19 116/70  08/16/19 118/78  08/15/19 (!) 122/62   Wt Readings from Last 3 Encounters:  11/17/19 121 lb (54.9 kg)  08/16/19 116 lb (52.6 kg)  08/15/19 117 lb (53.1 kg)   Body mass index is 19.53 kg/m.   Physical Exam Constitutional:      General: She is not in acute distress.    Appearance: Normal appearance. She is not ill-appearing.  HENT:     Head: Normocephalic and atraumatic.  Pulmonary:     Effort: Pulmonary effort is normal.     Breath sounds: Normal breath sounds. No wheezing or rhonchi.  Chest:     Chest wall: Tenderness (With palpation left lateral breast) present. No deformity or swelling.    Skin:    General: Skin is warm and dry.  Neurological:     Mental Status: She is alert.            Assessment & Plan:    See Problem List for Assessment and Plan of chronic medical problems.    This visit occurred during the SARS-CoV-2 public health emergency.  Safety protocols were in place, including screening questions prior to the visit, additional usage of staff PPE, and extensive cleaning of exam room while observing appropriate contact time as indicated for disinfecting solutions.

## 2019-11-17 ENCOUNTER — Ambulatory Visit: Payer: Federal, State, Local not specified - PPO | Admitting: Internal Medicine

## 2019-11-17 ENCOUNTER — Encounter: Payer: Self-pay | Admitting: Internal Medicine

## 2019-11-17 ENCOUNTER — Ambulatory Visit (INDEPENDENT_AMBULATORY_CARE_PROVIDER_SITE_OTHER): Payer: Federal, State, Local not specified - PPO

## 2019-11-17 ENCOUNTER — Other Ambulatory Visit: Payer: Self-pay

## 2019-11-17 VITALS — BP 116/70 | HR 59 | Temp 98.0°F | Ht 66.0 in | Wt 121.0 lb

## 2019-11-17 DIAGNOSIS — R0781 Pleurodynia: Secondary | ICD-10-CM | POA: Diagnosis not present

## 2019-11-17 NOTE — Assessment & Plan Note (Signed)
Acute Having left anterior-lateral mid rib pain after fall and landing on her water bottle that was attached to her left chest Likely contusion, but will check rib x-ray and chest x-ray to rule out possible fracture Continue Tylenol for pain relief, can take Advil as needed as well Call if symptoms do not continue to improve

## 2019-11-17 NOTE — Patient Instructions (Addendum)
  An xray was ordered for your rib/chest.     Continue Tylenol or advil for your pain.       Please call if there is no improvement in your symptoms.

## 2020-03-06 ENCOUNTER — Ambulatory Visit (INDEPENDENT_AMBULATORY_CARE_PROVIDER_SITE_OTHER): Payer: Federal, State, Local not specified - PPO

## 2020-03-06 ENCOUNTER — Ambulatory Visit (INDEPENDENT_AMBULATORY_CARE_PROVIDER_SITE_OTHER): Payer: Federal, State, Local not specified - PPO | Admitting: Sports Medicine

## 2020-03-06 ENCOUNTER — Other Ambulatory Visit: Payer: Self-pay

## 2020-03-06 DIAGNOSIS — M1712 Unilateral primary osteoarthritis, left knee: Secondary | ICD-10-CM | POA: Diagnosis not present

## 2020-03-06 DIAGNOSIS — G8929 Other chronic pain: Secondary | ICD-10-CM

## 2020-03-06 DIAGNOSIS — M25562 Pain in left knee: Secondary | ICD-10-CM | POA: Diagnosis not present

## 2020-03-06 NOTE — Assessment & Plan Note (Addendum)
This is a pleasant 60 year old female, she is very active, she has had about a month of left-sided knee pain, multiple locations around the knee, worse with terminal flexion, twisting. She has a bit of swelling on exam today, ligaments are stable, she does have pain with terminal flexion. I have recommended a body helix type neoprene knee sleeve. I do think she has a degenerative meniscal tear, she has some meloxicam left over that she will take for the next 2 weeks, adding meniscal rehab exercises, x-rays, she will cut her mileage down to about 50 to 60% and return to see me in 4 weeks, MRI/injection if no better.

## 2020-03-06 NOTE — Progress Notes (Signed)
    Procedures performed today:    None.  Independent interpretation of notes and tests performed by another provider:   None.  Brief History, Exam, Impression, and Recommendations:    Left knee pain This is a pleasant 60 year old female, she is very active, she has had about a month of left-sided knee pain, multiple locations around the knee, worse with terminal flexion, twisting. She has a bit of swelling on exam today, ligaments are stable, she does have pain with terminal flexion. I have recommended a body helix type neoprene knee sleeve. I do think she has a degenerative meniscal tear, she has some meloxicam left over that she will take for the next 2 weeks, adding meniscal rehab exercises, x-rays, she will cut her mileage down to about 50 to 60% and return to see me in 4 weeks, MRI/injection if no better.    ___________________________________________ Gwen Her. Dianah Field, M.D., ABFM., CAQSM. Primary Care and [place] Instructor of [employer]

## 2020-04-03 ENCOUNTER — Ambulatory Visit: Payer: Federal, State, Local not specified - PPO | Admitting: Sports Medicine

## 2020-05-25 DIAGNOSIS — M545 Low back pain, unspecified: Secondary | ICD-10-CM | POA: Diagnosis not present

## 2020-05-28 DIAGNOSIS — M545 Low back pain, unspecified: Secondary | ICD-10-CM | POA: Diagnosis not present

## 2020-06-08 DIAGNOSIS — M545 Low back pain, unspecified: Secondary | ICD-10-CM | POA: Diagnosis not present

## 2020-10-08 DIAGNOSIS — M545 Low back pain, unspecified: Secondary | ICD-10-CM | POA: Diagnosis not present

## 2020-10-24 DIAGNOSIS — D225 Melanocytic nevi of trunk: Secondary | ICD-10-CM | POA: Diagnosis not present

## 2020-10-24 DIAGNOSIS — D2272 Melanocytic nevi of left lower limb, including hip: Secondary | ICD-10-CM | POA: Diagnosis not present

## 2020-10-24 DIAGNOSIS — D2271 Melanocytic nevi of right lower limb, including hip: Secondary | ICD-10-CM | POA: Diagnosis not present

## 2020-10-24 DIAGNOSIS — L821 Other seborrheic keratosis: Secondary | ICD-10-CM | POA: Diagnosis not present

## 2021-02-04 DIAGNOSIS — D485 Neoplasm of uncertain behavior of skin: Secondary | ICD-10-CM | POA: Diagnosis not present

## 2021-02-04 DIAGNOSIS — D2239 Melanocytic nevi of other parts of face: Secondary | ICD-10-CM | POA: Diagnosis not present

## 2021-02-04 DIAGNOSIS — L720 Epidermal cyst: Secondary | ICD-10-CM | POA: Diagnosis not present

## 2021-02-04 DIAGNOSIS — L821 Other seborrheic keratosis: Secondary | ICD-10-CM | POA: Diagnosis not present

## 2021-03-01 DIAGNOSIS — M545 Low back pain, unspecified: Secondary | ICD-10-CM | POA: Diagnosis not present

## 2021-04-26 DIAGNOSIS — M545 Low back pain, unspecified: Secondary | ICD-10-CM | POA: Diagnosis not present

## 2021-05-06 DIAGNOSIS — M545 Low back pain, unspecified: Secondary | ICD-10-CM | POA: Diagnosis not present

## 2021-05-07 ENCOUNTER — Ambulatory Visit: Payer: Federal, State, Local not specified - PPO | Admitting: Sports Medicine

## 2021-05-07 ENCOUNTER — Other Ambulatory Visit: Payer: Self-pay | Admitting: Sports Medicine

## 2021-05-07 ENCOUNTER — Ambulatory Visit (INDEPENDENT_AMBULATORY_CARE_PROVIDER_SITE_OTHER): Payer: Federal, State, Local not specified - PPO

## 2021-05-07 ENCOUNTER — Other Ambulatory Visit (HOSPITAL_BASED_OUTPATIENT_CLINIC_OR_DEPARTMENT_OTHER): Payer: Self-pay

## 2021-05-07 DIAGNOSIS — M7701 Medial epicondylitis, right elbow: Secondary | ICD-10-CM | POA: Diagnosis not present

## 2021-05-07 DIAGNOSIS — M47812 Spondylosis without myelopathy or radiculopathy, cervical region: Secondary | ICD-10-CM | POA: Diagnosis not present

## 2021-05-07 DIAGNOSIS — M542 Cervicalgia: Secondary | ICD-10-CM | POA: Diagnosis not present

## 2021-05-07 DIAGNOSIS — M19021 Primary osteoarthritis, right elbow: Secondary | ICD-10-CM | POA: Diagnosis not present

## 2021-05-07 MED ORDER — PREDNISONE 50 MG PO TABS
ORAL_TABLET | ORAL | 0 refills | Status: DC
  Filled 2021-05-07: qty 5, 5d supply, fill #0

## 2021-05-07 NOTE — Assessment & Plan Note (Signed)
Danielle Arnold is a very pleasant 61 year old female, she has been doing some long bike rides, the most recent of which was over 30 miles, 4 hours on the bike, about a week or so ago, maybe 2, she has since had pain right upper trapezius. ?Nothing overtly radicular. ?I think this is related to cervical spondylosis, adding 5 days of prednisone, cervical conditioning, x-rays, return to see me in 2 and half weeks, because in 3 weeks she has another longer 100+ mile bike ride. ?

## 2021-05-07 NOTE — Progress Notes (Signed)
? ? ?  Procedures performed today:   ? ?None. ? ?Independent interpretation of notes and tests performed by another provider:  ? ?None. ? ?Brief History, Exam, Impression, and Recommendations:   ? ?Cervical spondylosis ?Danielle Arnold is a very pleasant 61 year old female, she has been doing some long bike rides, the most recent of which was over 30 miles, 4 hours on the bike, about a week or so ago, maybe 2, she has since had pain right upper trapezius. ?Nothing overtly radicular. ?I think this is related to cervical spondylosis, adding 5 days of prednisone, cervical conditioning, x-rays, return to see me in 2 and half weeks, because in 3 weeks she has another longer 100+ mile bike ride. ? ?Medial epicondylitis, right ?Please see for further details, adding x-rays, prednisone, conditioning. ? ? ? ?___________________________________________ ?Gwen Her. Dianah Field, M.D., ABFM., CAQSM. ?Primary Care and Sports Medicine ?[place] ? ?Adjunct Instructor of Family Medicine  ?[employer] ?

## 2021-05-07 NOTE — Assessment & Plan Note (Signed)
Please see for further details, adding x-rays, prednisone, conditioning. ?

## 2021-05-10 DIAGNOSIS — M545 Low back pain, unspecified: Secondary | ICD-10-CM | POA: Diagnosis not present

## 2021-05-15 DIAGNOSIS — M545 Low back pain, unspecified: Secondary | ICD-10-CM | POA: Diagnosis not present

## 2021-05-24 ENCOUNTER — Ambulatory Visit: Payer: Federal, State, Local not specified - PPO | Admitting: Sports Medicine

## 2021-11-07 DIAGNOSIS — M545 Low back pain, unspecified: Secondary | ICD-10-CM | POA: Diagnosis not present

## 2021-11-13 NOTE — Patient Instructions (Addendum)
Blood work was ordered.   The lab is on the first floor.    Medications changes include :   none     Return in about 1 year (around 11/15/2022) for Physical Exam.   Health Maintenance, Female Adopting a healthy lifestyle and getting preventive care are important in promoting health and wellness. Ask your health care provider about: The right schedule for you to have regular tests and exams. Things you can do on your own to prevent diseases and keep yourself healthy. What should I know about diet, weight, and exercise? Eat a healthy diet  Eat a diet that includes plenty of vegetables, fruits, low-fat dairy products, and lean protein. Do not eat a lot of foods that are high in solid fats, added sugars, or sodium. Maintain a healthy weight Body mass index (BMI) is used to identify weight problems. It estimates body fat based on height and weight. Your health care provider can help determine your BMI and help you achieve or maintain a healthy weight. Get regular exercise Get regular exercise. This is one of the most important things you can do for your health. Most adults should: Exercise for at least 150 minutes each week. The exercise should increase your heart rate and make you sweat (moderate-intensity exercise). Do strengthening exercises at least twice a week. This is in addition to the moderate-intensity exercise. Spend less time sitting. Even light physical activity can be beneficial. Watch cholesterol and blood lipids Have your blood tested for lipids and cholesterol at 63 years of age, then have this test every 5 years. Have your cholesterol levels checked more often if: Your lipid or cholesterol levels are high. You are older than 63 years of age. You are at high risk for heart disease. What should I know about cancer screening? Depending on your health history and family history, you may need to have cancer screening at various ages. This may include screening  for: Breast cancer. Cervical cancer. Colorectal cancer. Skin cancer. Lung cancer. What should I know about heart disease, diabetes, and high blood pressure? Blood pressure and heart disease High blood pressure causes heart disease and increases the risk of stroke. This is more likely to develop in people who have high blood pressure readings or are overweight. Have your blood pressure checked: Every 3-5 years if you are 83-18 years of age. Every year if you are 61 years old or older. Diabetes Have regular diabetes screenings. This checks your fasting blood sugar level. Have the screening done: Once every three years after age 86 if you are at a normal weight and have a low risk for diabetes. More often and at a younger age if you are overweight or have a high risk for diabetes. What should I know about preventing infection? Hepatitis B If you have a higher risk for hepatitis B, you should be screened for this virus. Talk with your health care provider to find out if you are at risk for hepatitis B infection. Hepatitis C Testing is recommended for: Everyone born from [birth year] through 1967. Anyone with known risk factors for hepatitis C. Sexually transmitted infections (STIs) Get screened for STIs, including gonorrhea and chlamydia, if: You are sexually active and are younger than 63 years of age. You are older than 63 years of age and your health care provider tells you that you are at risk for this type of infection. Your sexual activity has changed since you were last screened, and you are at  increased risk for chlamydia or gonorrhea. Ask your health care provider if you are at risk. Ask your health care provider about whether you are at high risk for HIV. Your health care provider may recommend a prescription medicine to help prevent HIV infection. If you choose to take medicine to prevent HIV, you should first get tested for HIV. You should then be tested every 3 months for as long as you  are taking the medicine. Pregnancy If you are about to stop having your period (premenopausal) and you may become pregnant, seek counseling before you get pregnant. Take 400 to 800 micrograms (mcg) of folic acid every day if you become pregnant. Ask for birth control (contraception) if you want to prevent pregnancy. Osteoporosis and menopause Osteoporosis is a disease in which the bones lose minerals and strength with aging. This can result in bone fractures. If you are 62 years old or older, or if you are at risk for osteoporosis and fractures, ask your health care provider if you should: Be screened for bone loss. Take a calcium or vitamin D supplement to lower your risk of fractures. Be given hormone replacement therapy (HRT) to treat symptoms of menopause. Follow these instructions at home: Alcohol use Do not drink alcohol if: Your health care provider tells you not to drink. You are pregnant, may be pregnant, or are planning to become pregnant. If you drink alcohol: Limit how much you have to: 0-1 drink a day. Know how much alcohol is in your drink. In the [place] one drink equals one 12 oz bottle of beer (355 mL), one 5 oz glass of wine (148 mL), or one 1 oz glass of hard liquor (44 mL). Lifestyle Do not use any products that contain nicotine or tobacco. These products include cigarettes, chewing tobacco, and vaping devices, such as e-cigarettes. If you need help quitting, ask your health care provider. Do not use street drugs. Do not share needles. Ask your health care provider for help if you need support or information about quitting drugs. General instructions Schedule regular health, dental, and eye exams. Stay current with your vaccines. Tell your health care provider if: You often feel depressed. You have ever been abused or do not feel safe at home. Summary Adopting a healthy lifestyle and getting preventive care are important in promoting health and wellness. Follow your  health care provider's instructions about healthy diet, exercising, and getting tested or screened for diseases. Follow your health care provider's instructions on monitoring your cholesterol and blood pressure. This information is not intended to replace advice given to you by your health care provider. Make sure you discuss any questions you have with your health care provider. Document Revised: 05/21/2020 Document Reviewed: 05/21/2020 Elsevier Patient Education  [place].

## 2021-11-13 NOTE — Progress Notes (Unsigned)
Subjective:    Patient ID: Danielle Arnold, female    DOB: March 25, 1960, 61 y.o.   MRN: [id number]      HPI Danielle Arnold is here for a Physical exam.   Overall doing well.  No major concerns.   Sugars in 90's and low 100's when she has checked it - concerned about sugar.     Not sleeping well.  Wakes frequently.    Medications and allergies reviewed with patient and updated if appropriate.  Current Outpatient Medications on File Prior to Visit  Medication Sig Dispense Refill   Cholecalciferol (VITAMIN D3) 5000 units CAPS Take 1 capsule by mouth every other day.      Cyanocobalamin (VITAMIN B-12 PO) Take 1,000 mg by mouth. Take 2-3 times weekly     Multiple Vitamin (MULTIVITAMIN) tablet Take 1 tablet by mouth daily.     No current facility-administered medications on file prior to visit.    Review of Systems  Constitutional:  Negative for fever.  HENT:  Negative for postnasal drip.   Eyes:  Negative for visual disturbance.  Respiratory:  Positive for cough. Negative for shortness of breath and wheezing.   Cardiovascular:  Negative for chest pain, palpitations and leg swelling.  Gastrointestinal:  Negative for abdominal pain, blood in stool, constipation, diarrhea and nausea.       No gerd  Genitourinary:  Negative for dysuria.  Musculoskeletal:  Negative for arthralgias and back pain.  Skin:  Negative for rash.  Neurological:  Negative for light-headedness and headaches.  Psychiatric/Behavioral:  Negative for dysphoric mood. The patient is not nervous/anxious.   All other systems reviewed and are negative.      Objective:   Vitals:   11/14/21 0851  BP: 110/68  Pulse: 60  Temp: 98 F (36.7 C)  SpO2: 99%   Filed Weights   11/14/21 0851  Weight: 119 lb 3.2 oz (54.1 kg)   Body mass index is 19.24 kg/m.  BP Readings from Last 3 Encounters:  11/14/21 110/68  11/17/19 116/70  08/16/19 118/78    Wt Readings from Last 3 Encounters:  11/14/21 119 lb 3.2 oz  (54.1 kg)  11/17/19 121 lb (54.9 kg)  08/16/19 116 lb (52.6 kg)       Physical Exam Constitutional: She appears well-developed and well-nourished. No distress.  HENT:  Head: Normocephalic and atraumatic.  Right Ear: External ear normal. Normal ear canal and TM Left Ear: External ear normal.  Normal ear canal and TM Mouth/Throat: Oropharynx is clear and moist.  Eyes: Conjunctivae normal.  Neck: Neck supple. No tracheal deviation present. No thyromegaly present.  No carotid bruit  Cardiovascular: Normal rate, regular rhythm and normal heart sounds.   No murmur heard.  No edema. Pulmonary/Chest: Effort normal and breath sounds normal. No respiratory distress. She has no wheezes. She has no rales.  Breast: deferred   Abdominal: Soft. She exhibits no distension. There is no tenderness.  Lymphadenopathy: She has no cervical adenopathy.  Skin: Skin is warm and dry. She is not diaphoretic.  Psychiatric: She has a normal mood and affect. Her behavior is normal.     Lab Results  Component Value Date   WBC 3.9 08/16/2019   HGB 13.2 08/16/2019   HCT 40.7 08/16/2019   PLT 284 08/16/2019   GLUCOSE 89 08/16/2019   CHOL 161 08/16/2019   TRIG 54 08/16/2019   HDL 66 08/16/2019   LDLCALC 82 08/16/2019   ALT 15 08/16/2019   AST 21 08/16/2019  NA 134 (L) 08/16/2019   K 5.1 08/16/2019   CL 100 08/16/2019   CREATININE 0.80 08/16/2019   BUN 9 08/16/2019   CO2 28 08/16/2019   TSH 3.15 08/16/2019   HGBA1C 4.9 03/10/2017         Assessment & Plan:   Physical exam: Screening blood work  ordered Exercise  regular-walking, hiking Weight  normal Substance abuse  none   Reviewed recommended immunizations.   Health Maintenance  Topic Date Due   Hepatitis C Screening  Never done   Zoster Vaccines- Shingrix (1 of 2) 02/14/2022 (Originally 09/10/2008)   INFLUENZA VACCINE  04/13/2022 (Originally 08/13/2021)   MAMMOGRAM  11/15/2022 (Originally 09/26/2021)   DEXA SCAN  11/15/2022  (Originally Jul 26, 1960)   COLONOSCOPY (Pts 45-54yr Insurance coverage will need to be confirmed)  11/15/2022 (Originally 01/12/2017)   TETANUS/TDAP  11/15/2022 (Originally 11/20/2020)   PAP SMEAR-Modifier  08/14/2024   HIV Screening  Completed   HPV VACCINES  Aged Out          See Problem List for Assessment and Plan of chronic medical problems.

## 2021-11-14 ENCOUNTER — Ambulatory Visit (INDEPENDENT_AMBULATORY_CARE_PROVIDER_SITE_OTHER): Payer: Federal, State, Local not specified - PPO | Admitting: Internal Medicine

## 2021-11-14 ENCOUNTER — Encounter: Payer: Self-pay | Admitting: Internal Medicine

## 2021-11-14 VITALS — BP 110/68 | HR 60 | Temp 98.0°F | Ht 66.0 in | Wt 119.2 lb

## 2021-11-14 DIAGNOSIS — Z1159 Encounter for screening for other viral diseases: Secondary | ICD-10-CM

## 2021-11-14 DIAGNOSIS — E538 Deficiency of other specified B group vitamins: Secondary | ICD-10-CM

## 2021-11-14 DIAGNOSIS — E559 Vitamin D deficiency, unspecified: Secondary | ICD-10-CM

## 2021-11-14 DIAGNOSIS — R739 Hyperglycemia, unspecified: Secondary | ICD-10-CM | POA: Diagnosis not present

## 2021-11-14 DIAGNOSIS — Z0000 Encounter for general adult medical examination without abnormal findings: Secondary | ICD-10-CM

## 2021-11-14 DIAGNOSIS — E782 Mixed hyperlipidemia: Secondary | ICD-10-CM | POA: Diagnosis not present

## 2021-11-14 LAB — CBC WITH DIFFERENTIAL/PLATELET
Basophils Absolute: 0.1 10*3/uL (ref 0.0–0.1)
Basophils Relative: 0.9 % (ref 0.0–3.0)
Eosinophils Absolute: 0 10*3/uL (ref 0.0–0.7)
Eosinophils Relative: 0.6 % (ref 0.0–5.0)
HCT: 40.5 % (ref 36.0–46.0)
Hemoglobin: 13.4 g/dL (ref 12.0–15.0)
Lymphocytes Relative: 19.8 % (ref 12.0–46.0)
Lymphs Abs: 1.1 10*3/uL (ref 0.7–4.0)
MCHC: 33.1 g/dL (ref 30.0–36.0)
MCV: 88.8 fl (ref 78.0–100.0)
Monocytes Absolute: 0.4 10*3/uL (ref 0.1–1.0)
Monocytes Relative: 7.8 % (ref 3.0–12.0)
Neutro Abs: 3.9 10*3/uL (ref 1.4–7.7)
Neutrophils Relative %: 70.9 % (ref 43.0–77.0)
Platelets: 269 10*3/uL (ref 150.0–400.0)
RBC: 4.56 Mil/uL (ref 3.87–5.11)
RDW: 13.9 % (ref 11.5–15.5)
WBC: 5.4 10*3/uL (ref 4.0–10.5)

## 2021-11-14 LAB — COMPREHENSIVE METABOLIC PANEL
ALT: 17 U/L (ref 0–35)
AST: 25 U/L (ref 0–37)
Albumin: 4.5 g/dL (ref 3.5–5.2)
Alkaline Phosphatase: 90 U/L (ref 39–117)
BUN: 10 mg/dL (ref 6–23)
CO2: 30 mEq/L (ref 19–32)
Calcium: 9.7 mg/dL (ref 8.4–10.5)
Chloride: 98 mEq/L (ref 96–112)
Creatinine, Ser: 0.62 mg/dL (ref 0.40–1.20)
GFR: 94.94 mL/min (ref >60.00)
Glucose, Bld: 85 mg/dL (ref 70–99)
Potassium: 4.3 mEq/L (ref 3.5–5.1)
Sodium: 135 mEq/L (ref 135–145)
Total Bilirubin: 0.7 mg/dL (ref 0.2–1.2)
Total Protein: 7.5 g/dL (ref 6.0–8.3)

## 2021-11-14 LAB — LIPID PANEL
Cholesterol: 163 mg/dL (ref 0–200)
HDL: 62.5 mg/dL (ref >39.00)
LDL Cholesterol: 86 mg/dL (ref 0–99)
NonHDL: 100.3
Total CHOL/HDL Ratio: 3
Triglycerides: 71 mg/dL (ref 0.0–149.0)
VLDL: 14.2 mg/dL (ref 0.0–40.0)

## 2021-11-14 LAB — HEMOGLOBIN A1C: Hgb A1c MFr Bld: 5.2 % (ref 4.6–6.5)

## 2021-11-14 LAB — TSH: TSH: 3.1 u[IU]/mL (ref 0.35–5.50)

## 2021-11-14 LAB — VITAMIN D 25 HYDROXY (VIT D DEFICIENCY, FRACTURES): VITD: 32.61 ng/mL (ref 30.00–100.00)

## 2021-11-14 LAB — VITAMIN B12: Vitamin B-12: 396 pg/mL (ref 211–911)

## 2021-11-14 NOTE — Assessment & Plan Note (Signed)
Has checked fasting sugars at home recently - high 90's- low 100's  Will check a1c

## 2021-11-14 NOTE — Assessment & Plan Note (Signed)
Chronic Taking vitamin D daily Check vitamin D level  

## 2021-11-14 NOTE — Assessment & Plan Note (Signed)
Chronic Eats a plant-based diet Taking vitamin B-12 supplementation Check vitamin B12 level

## 2021-11-14 NOTE — Assessment & Plan Note (Signed)
Chronic Regular exercise and healthy diet encouraged Check lipid panel, CMP, TSH Continue lifestyle control

## 2021-11-15 LAB — HEPATITIS C ANTIBODY: Hepatitis C Ab: NONREACTIVE

## 2022-02-03 DIAGNOSIS — D225 Melanocytic nevi of trunk: Secondary | ICD-10-CM | POA: Diagnosis not present

## 2022-02-03 DIAGNOSIS — L218 Other seborrheic dermatitis: Secondary | ICD-10-CM | POA: Diagnosis not present

## 2022-02-03 DIAGNOSIS — Z129 Encounter for screening for malignant neoplasm, site unspecified: Secondary | ICD-10-CM | POA: Diagnosis not present

## 2022-04-18 ENCOUNTER — Ambulatory Visit (HOSPITAL_BASED_OUTPATIENT_CLINIC_OR_DEPARTMENT_OTHER)
Admission: RE | Admit: 2022-04-18 | Discharge: 2022-04-18 | Disposition: A | Discharge status: Home / Self Care | Payer: Federal, State, Local not specified - PPO | Source: Ambulatory Visit | Attending: Family Medicine | Admitting: Family Medicine

## 2022-04-18 ENCOUNTER — Encounter: Payer: Self-pay | Admitting: Family Medicine

## 2022-04-18 ENCOUNTER — Ambulatory Visit (INDEPENDENT_AMBULATORY_CARE_PROVIDER_SITE_OTHER): Payer: Federal, State, Local not specified - PPO | Admitting: Family Medicine

## 2022-04-18 ENCOUNTER — Other Ambulatory Visit (HOSPITAL_BASED_OUTPATIENT_CLINIC_OR_DEPARTMENT_OTHER): Payer: Self-pay

## 2022-04-18 VITALS — BP 126/70 | Ht 66.0 in | Wt 119.0 lb

## 2022-04-18 DIAGNOSIS — M25551 Pain in right hip: Secondary | ICD-10-CM | POA: Diagnosis not present

## 2022-04-18 DIAGNOSIS — M24151 Other articular cartilage disorders, right hip: Secondary | ICD-10-CM | POA: Insufficient documentation

## 2022-04-18 MED ORDER — PREDNISONE 5 MG PO TABS
ORAL_TABLET | ORAL | 0 refills | Status: DC
  Filled 2022-04-18: qty 21, 6d supply, fill #0

## 2022-04-18 NOTE — Patient Instructions (Addendum)
Nice to meet you  Please alternate heat and ice You can try the exercises. Do not perform exercises if pain is greater than 5/10  We'll call with the xray results.  Please send me a message in MyChart with any questions or updates.  Please see me back in 2 weeks or pending the results.   --Dr. Jordan Likes

## 2022-04-18 NOTE — Assessment & Plan Note (Signed)
Acutely occurring in the right hip.  She walks several steps through the course of the day and denies any specific injury.  She has pain in the anterior hip joint.  Concern for irritation of the labrum versus a stress fracture. -Counseled on home exercise therapy and supportive care. -X-ray. -Prednisone. -Could consider further imaging

## 2022-04-18 NOTE — Progress Notes (Signed)
  Danielle Arnold - 62 y.o. female MRN [id number]  Date of birth: [birth year]  SUBJECTIVE:  Including CC & ROS.  No chief complaint on file.   Danielle Arnold is a 62 y.o. female that is presenting with acute right hip pain.  The pain has been ongoing for the past few days.  She walks on a regular basis.  Denies any new or different activities.  No history of any form of stress fracture.  No history of surgery.    Review of Systems See HPI   HISTORY: Past Medical, Surgical, Social, and Family History Reviewed & Updated per EMR.   Pertinent Historical Findings include:  Past Medical History:  Diagnosis Date   Abnormal Pap smear and cervical HPV (human papillomavirus) 1989   Cone biopsy  ????dysplasia   Anxiety    mild on no meds   Endometriosis    Fatty liver    Fibroid    Infection    UTI   Liver hemangioma    Menopausal hot flushes    Pure hypercholesterolemia    Vitamin D insufficiency     Past Surgical History:  Procedure Laterality Date   APPENDECTOMY  4   CERVICAL CONE BIOPSY  89   DILATION AND CURETTAGE OF UTERUS  3/11   ear operation Bilateral 89 67 68   ear drum repair   MYOMECTOMY  2000   UTERINE FIBROID EMBOLIZATION  2003     PHYSICAL EXAM:  VS: BP 126/70 (BP Location: Left Arm, Patient Position: Sitting)   Ht 5\' 6"  (1.676 m)   Wt 119 lb (54 kg)   LMP 05/14/2010 (Within Years)   BMI 19.21 kg/m  Physical Exam Gen: NAD, alert, cooperative with exam, well-appearing MSK:  Neurovascularly intact       ASSESSMENT & PLAN:   Degenerative tear of acetabular labrum of right hip Acutely occurring in the right hip.  She walks several steps through the course of the day and denies any specific injury.  She has pain in the anterior hip joint.  Concern for irritation of the labrum versus a stress fracture. -Counseled on home exercise therapy and supportive care. -X-ray. -Prednisone. -Could consider further imaging

## 2022-04-22 ENCOUNTER — Telehealth: Payer: Self-pay | Admitting: Family Medicine

## 2022-04-22 NOTE — Telephone Encounter (Signed)
Informed of results.   Myra Rude, MD [facility] 04/22/2022, 10:26 AM

## 2022-04-28 ENCOUNTER — Encounter: Payer: Self-pay | Admitting: *Deleted

## 2022-05-07 ENCOUNTER — Other Ambulatory Visit (HOSPITAL_BASED_OUTPATIENT_CLINIC_OR_DEPARTMENT_OTHER): Payer: Self-pay

## 2022-05-07 ENCOUNTER — Encounter: Payer: Self-pay | Admitting: Obstetrics and Gynecology

## 2022-05-07 ENCOUNTER — Ambulatory Visit: Payer: Federal, State, Local not specified - PPO | Admitting: Obstetrics and Gynecology

## 2022-05-07 VITALS — BP 118/74 | HR 63 | Wt 123.8 lb

## 2022-05-07 DIAGNOSIS — R351 Nocturia: Secondary | ICD-10-CM | POA: Diagnosis not present

## 2022-05-07 DIAGNOSIS — N952 Postmenopausal atrophic vaginitis: Secondary | ICD-10-CM | POA: Diagnosis not present

## 2022-05-07 DIAGNOSIS — E2839 Other primary ovarian failure: Secondary | ICD-10-CM

## 2022-05-07 MED ORDER — ESTRADIOL 10 MCG VA TABS
ORAL_TABLET | VAGINAL | 1 refills | Status: DC
  Filled 2022-05-07: qty 24, 66d supply, fill #0

## 2022-05-07 NOTE — Progress Notes (Signed)
GYNECOLOGY  VISIT   HPI: 62 y.o.   Single White or Caucasian Not Hispanic or Latino  female   G0P0000 with Patient's last menstrual period was 05/14/2010 (within years).   here to discuss hormones. She states that she feels like she is always irritates in the vaginal area because her skin is thin and she would like to talk about vaginal estrogen.   Not sexually active, last tried in 2017 and it was too uncomfortable.    Last mammogram was in 9/21.     GYNECOLOGIC HISTORY: Patient's last menstrual period was 05/14/2010 (within years). Contraception:pmp Menopausal hormone therapy: none         OB History     Gravida  0   Para  0   Term  0   Preterm  0   AB  0   Living  0      SAB  0   IAB  0   Ectopic  0   Multiple  0   Live Births                 Patient Active Problem List   Diagnosis Date Noted   Degenerative tear of acetabular labrum of right hip 04/18/2022   Hyperglycemia 11/14/2021   Cervical spondylosis 05/07/2021   Medial epicondylitis, right 05/07/2021   Left knee pain 03/06/2020   Rib pain on right side 11/17/2019   Vitamin D deficiency 08/16/2019   Vitamin B12 deficiency 08/16/2019   Metatarsalgia, left foot 10/09/2016   Right patellofemoral syndrome 12/28/2014   Osteoarthritis of left hip 08/18/2013   Hyperlipidemia 12/31/2011   Vaginal dryness 12/25/2011   Left lumbar radiculitis 11/27/2011   Sciatica 11/18/2011   Plantar fasciitis 07/06/2011   Heel spur 07/06/2011   Irritable bowel 11/21/2010   FH: breast cancer in first degree relative 10/31/2010   Anxiety    Menopausal hot flushes     Past Medical History:  Diagnosis Date   Abnormal Pap smear and cervical HPV (human papillomavirus) 1989   Cone biopsy  ????dysplasia   Anxiety    mild on no meds   Endometriosis    Fatty liver    Fibroid    Infection    UTI   Liver hemangioma    Menopausal hot flushes    Pure hypercholesterolemia    Vitamin D insufficiency     Past  Surgical History:  Procedure Laterality Date   APPENDECTOMY  51   CERVICAL CONE BIOPSY  89   DILATION AND CURETTAGE OF UTERUS  3/11   ear operation Bilateral 89 67 68   ear drum repair   MYOMECTOMY  2000   UTERINE FIBROID EMBOLIZATION  2003    Current Outpatient Medications  Medication Sig Dispense Refill   Cholecalciferol (VITAMIN D3) 5000 units CAPS Take 1 capsule by mouth every other day.      Cyanocobalamin (VITAMIN B-12 PO) Take 1,000 mg by mouth. Take 2-3 times weekly     Multiple Vitamin (MULTIVITAMIN) tablet Take 1 tablet by mouth daily.     predniSONE (DELTASONE) 5 MG tablet Take 6 tablets for first day, 5 tablets for second day, 4 tablets third day, 3 tablets fourth day, 2 tablets the fifth day, and 1 tablet sixth day. 21 tablet 0   No current facility-administered medications for this visit.     ALLERGIES: Penicillins and Percocet [oxycodone-acetaminophen]  Family History  Problem Relation Age of Onset   Dementia Mother    Cancer Sister  breast  premenopausal   Breast cancer Sister 83   Cancer Maternal Grandmother        breast   Sudden death Neg Hx    Hypertension Neg Hx    Heart attack Neg Hx    Hyperlipidemia Neg Hx     Social History   Socioeconomic History   Marital status: Single    Spouse name: Not on file   Number of children: 0   Years of education: Not on file   Highest education level: Some college, no degree  Occupational History   Not on file  Tobacco Use   Smoking status: Never   Smokeless tobacco: Never  Vaping Use   Vaping Use: Never used  Substance and Sexual Activity   Alcohol use: Yes    Alcohol/week: 1.0 standard drink of alcohol    Types: 1 Glasses of wine per week   Drug use: No   Sexual activity: Never    Partners: Male  Other Topics Concern   Not on file  Social History Narrative   Lives at home alone   Left handed   Drinks 1 cup of green tea daily   Social Determinants of Health   Financial Resource Strain:  Not on file  Food Insecurity: Not on file  Transportation Needs: Not on file  Physical Activity: Not on file  Stress: Not on file  Social Connections: Not on file  Intimate Partner Violence: Not on file    Review of Systems  All other systems reviewed and are negative. She reports nocturia 2-3 x a night, thinks she is voiding normal amounts, no other urinary c/o.  PHYSICAL EXAMINATION:    BP 118/74   Pulse 63   Wt 123 lb 12.8 oz (56.2 kg)   LMP 05/14/2010 (Within Years)   SpO2 100%   BMI 19.98 kg/m     General appearance: alert, cooperative and appears stated age   Pelvic: External genitalia:  no lesions              Urethra:  normal appearing urethra with no masses, tenderness or lesions              Bartholins and Skenes: normal                 Vagina: atrophic appearing vagina with normal color and discharge, no lesions. Uncomfortable with use of an adolescent speculum              Cervix: no lesions               Chaperone was present for exam.  1. Vaginal atrophy No contraindications - Estradiol 10 MCG TABS vaginal tablet; Place one tablet vaginally qhs x 1 week, then change to 2 x a week at hs  Dispense: 24 tablet; Refill: 1 -Mammogram is due, she will schedule -F/U in 6 weeks for an annual exam, we discussed adding a small amount of estrace cream topically if she feels like she needs it.   2. Hypoestrogenism Weight 123 lbs - DG Bone Density; Future  3. Nocturia She doesn't drink caffeine or ETOH Voiding normal amounts, discussed limiting fluid intake for several hours prior to bed -Discussed sleep hygiene

## 2022-05-08 ENCOUNTER — Ambulatory Visit: Payer: Federal, State, Local not specified - PPO | Admitting: Sports Medicine

## 2022-05-08 DIAGNOSIS — G6289 Other specified polyneuropathies: Secondary | ICD-10-CM

## 2022-05-08 DIAGNOSIS — E538 Deficiency of other specified B group vitamins: Secondary | ICD-10-CM

## 2022-05-08 DIAGNOSIS — G629 Polyneuropathy, unspecified: Secondary | ICD-10-CM | POA: Insufficient documentation

## 2022-05-08 DIAGNOSIS — M160 Bilateral primary osteoarthritis of hip: Secondary | ICD-10-CM

## 2022-05-08 MED ORDER — ACETAMINOPHEN ER 650 MG PO TBCR
650.0000 mg | EXTENDED_RELEASE_TABLET | Freq: Three times a day (TID) | ORAL | 3 refills | Status: DC | PRN
Start: 2022-05-08 — End: 2023-01-27

## 2022-05-08 NOTE — Progress Notes (Signed)
    Procedures performed today:    None.  Independent interpretation of notes and tests performed by another provider:   None.  Brief History, Exam, Impression, and Recommendations:    Primary osteoarthritis of both hips Increasing pain right hip, saw Dr. Jordan Likes, x-rays confirmed hip osteoarthritis on the right, known osteoarthritis on the left, continue anti-inflammatory diet, she can do arthritis from Tylenol, adding home conditioning, return to see me in about 6 weeks. We discussed the pathophysiology and natural history of osteoarthritis as well as treatment approach.  Peripheral neuropathy Also having paresthesias bilateral feet, all toes, top and bottom of the foot bilaterally. Nothing in the hands. Known lumbar DDD. Adding a serum workup, if negative we will proceed with nerve conduction and EMG.  I spent 30 minutes of total time managing this patient today, this includes chart review, face to face, and non-face to face time.  ____________________________________________ Ihor Austin. Benjamin Stain, M.D., ABFM., CAQSM., AME. Primary Care and Sports Medicine [facility]  Adjunct Professor of Family Medicine  [employer]  Restaurant manager, fast food

## 2022-05-08 NOTE — Assessment & Plan Note (Signed)
Also having paresthesias bilateral feet, all toes, top and bottom of the foot bilaterally. Nothing in the hands. Known lumbar DDD. Adding a serum workup, if negative we will proceed with nerve conduction and EMG.

## 2022-05-08 NOTE — Assessment & Plan Note (Signed)
Increasing pain right hip, saw Dr. Jordan Likes, x-rays confirmed hip osteoarthritis on the right, known osteoarthritis on the left, continue anti-inflammatory diet, she can do arthritis from Tylenol, adding home conditioning, return to see me in about 6 weeks. We discussed the pathophysiology and natural history of osteoarthritis as well as treatment approach.

## 2022-05-13 DIAGNOSIS — G6289 Other specified polyneuropathies: Secondary | ICD-10-CM | POA: Diagnosis not present

## 2022-05-13 DIAGNOSIS — E538 Deficiency of other specified B group vitamins: Secondary | ICD-10-CM | POA: Diagnosis not present

## 2022-05-14 LAB — VITAMIN B12: Vitamin B-12: 440 pg/mL (ref 200–1100)

## 2022-05-14 LAB — CBC: MCV: 89 fL (ref 80.0–100.0)

## 2022-05-14 LAB — COMPREHENSIVE METABOLIC PANEL: Total Protein: 6.8 g/dL (ref 6.1–8.1)

## 2022-05-16 LAB — PROTEIN ELECTROPHORESIS, SERUM: Alpha 1: 0.3 g/dL (ref 0.2–0.3)

## 2022-05-17 LAB — CBC: RDW: 12.2 % (ref 11.0–15.0)

## 2022-05-17 LAB — PROTEIN ELECTROPHORESIS,W/TOTAL PROTEIN AND RFLX TO IFE,URINE

## 2022-05-19 ENCOUNTER — Encounter: Payer: Self-pay | Admitting: Internal Medicine

## 2022-05-19 DIAGNOSIS — R7983 Abnormal findings of blood amino-acid level: Secondary | ICD-10-CM | POA: Insufficient documentation

## 2022-05-22 ENCOUNTER — Ambulatory Visit (HOSPITAL_BASED_OUTPATIENT_CLINIC_OR_DEPARTMENT_OTHER)
Admission: RE | Admit: 2022-05-22 | Discharge: 2022-05-22 | Disposition: A | Discharge status: Home / Self Care | Payer: Federal, State, Local not specified - PPO | Source: Ambulatory Visit | Attending: Obstetrics and Gynecology | Admitting: Obstetrics and Gynecology

## 2022-05-22 DIAGNOSIS — E2839 Other primary ovarian failure: Secondary | ICD-10-CM | POA: Diagnosis not present

## 2022-05-22 DIAGNOSIS — M810 Age-related osteoporosis without current pathological fracture: Secondary | ICD-10-CM | POA: Diagnosis not present

## 2022-05-22 LAB — PROTEIN ELECTROPHORESIS,W/TOTAL PROTEIN AND RFLX TO IFE,URINE: Creatinine, 24H Ur: 0.62 g/(24.h) (ref 0.50–2.15)

## 2022-05-22 LAB — METHYLMALONIC ACID AND HOMOCYSTEINE
Homocysteine: 11.3 umol/L — ABNORMAL HIGH (ref <10.4)
Methylmalonic Acid, Quant: 114 nmol/L (ref 87–318)

## 2022-05-22 LAB — ZINC: Zinc: 60 ug/dL (ref 60–130)

## 2022-05-22 LAB — CBC
HCT: 38 % (ref 35.0–45.0)
Hemoglobin: 12.5 g/dL (ref 11.7–15.5)
MCH: 29.3 pg (ref 27.0–33.0)
MCHC: 32.9 g/dL (ref 32.0–36.0)
MPV: 11.2 fL (ref 7.5–12.5)
Platelets: 256 Thousand/uL (ref 140–400)
RBC: 4.27 Million/uL (ref 3.80–5.10)
WBC: 4.3 10*3/uL (ref 3.8–10.8)

## 2022-05-22 LAB — COMPREHENSIVE METABOLIC PANEL WITH GFR
AG Ratio: 1.6 (calc) (ref 1.0–2.5)
AST: 19 U/L (ref 10–35)
Albumin: 4.2 g/dL (ref 3.6–5.1)
Alkaline phosphatase (APISO): 101 U/L (ref 37–153)
BUN: 9 mg/dL (ref 7–25)
CO2: 27 mmol/L (ref 20–32)
Calcium: 9 mg/dL (ref 8.6–10.4)
Chloride: 96 mmol/L — ABNORMAL LOW (ref 98–110)
Creat: 0.68 mg/dL (ref 0.50–1.05)
Glucose, Bld: 87 mg/dL (ref 65–99)
Potassium: 3.6 mmol/L (ref 3.5–5.3)
Sodium: 129 mmol/L — ABNORMAL LOW (ref 135–146)
Total Bilirubin: 0.7 mg/dL (ref 0.2–1.2)

## 2022-05-22 LAB — PROTEIN ELECTROPHORESIS, SERUM
Albumin ELP: 4.2 g/dL (ref 3.8–4.8)
Alpha 2: 0.7 g/dL (ref 0.5–0.9)
Beta 2: 0.3 g/dL (ref 0.2–0.5)
Beta Globulin: 0.4 g/dL (ref 0.4–0.6)
Gamma Globulin: 1.1 g/dL (ref 0.8–1.7)
Total Protein: 7 g/dL (ref 6.1–8.1)

## 2022-05-22 LAB — HEMOGLOBIN A1C
Hgb A1c MFr Bld: 5.2 %{Hb} (ref <5.7)
Mean Plasma Glucose: 103 mg/dL
eAG (mmol/L): 5.7 mmol/L

## 2022-05-22 LAB — COMPREHENSIVE METABOLIC PANEL
ALT: 13 U/L (ref 6–29)
Globulin: 2.6 g/dL (calc) (ref 1.9–3.7)

## 2022-05-22 LAB — COPPER, SERUM: Copper: 103 ug/dL (ref 70–175)

## 2022-05-22 LAB — HEAVY METALS PROFILE, URINE
Arsenic, 24H Ur: <10 ug/L (ref <=80)
Lead, 24 hr urine: <10 mcg/L (ref <80)
Mercury, 24H Ur: <4 ug/L (ref <=20)

## 2022-05-22 LAB — VITAMIN B1: Vitamin B1 (Thiamine): 14 nmol/L (ref 8–30)

## 2022-05-22 LAB — TSH: TSH: 2.84 m[IU]/L (ref 0.40–4.50)

## 2022-06-02 DIAGNOSIS — R2681 Unsteadiness on feet: Secondary | ICD-10-CM | POA: Diagnosis not present

## 2022-06-12 DIAGNOSIS — M25651 Stiffness of right hip, not elsewhere classified: Secondary | ICD-10-CM | POA: Diagnosis not present

## 2022-06-12 DIAGNOSIS — M25652 Stiffness of left hip, not elsewhere classified: Secondary | ICD-10-CM | POA: Diagnosis not present

## 2022-06-12 DIAGNOSIS — M25552 Pain in left hip: Secondary | ICD-10-CM | POA: Diagnosis not present

## 2022-06-12 DIAGNOSIS — M25551 Pain in right hip: Secondary | ICD-10-CM | POA: Diagnosis not present

## 2022-06-16 DIAGNOSIS — M25651 Stiffness of right hip, not elsewhere classified: Secondary | ICD-10-CM | POA: Diagnosis not present

## 2022-06-16 DIAGNOSIS — M25652 Stiffness of left hip, not elsewhere classified: Secondary | ICD-10-CM | POA: Diagnosis not present

## 2022-06-16 DIAGNOSIS — M25551 Pain in right hip: Secondary | ICD-10-CM | POA: Diagnosis not present

## 2022-06-16 DIAGNOSIS — M25552 Pain in left hip: Secondary | ICD-10-CM | POA: Diagnosis not present

## 2022-06-16 NOTE — Progress Notes (Signed)
62 y.o. G0P0000 Single White or Caucasian Not Hispanic or Latino female here for annual exam and to discuss recent DEXA.     Recent DEXA returned with osteoporosis in her hip and spine, low T score of -3.3. She exercises regularly. Eats lots of green leafy.   Labs reviewed: Normal CMP on 06/19/22 Normal TSH on 05/13/22 Normal CBC on 05/13/22  She was started on vaginal estrogen in 4/24 for atrophy and discomfort. She is feeling better. She hasn't been sexually active for years secondary to discomfort. Not dating currently.   Patient's last menstrual period was 05/14/2010 (within years).          Sexually active: No.  The current method of family planning is post menopausal status.    Exercising: Yes.     Hiking biking, walking  trying to do more weights. She is also seeing PT.  Smoker:  no  Health Maintenance: Pap:  08/15/19 WNL, negative hpv; 07/05/15 WNL, 12/27/12 WNL  History of abnormal Pap:  Yes in her 20's, [facility] biopsy  MMG: DIAG 09/27/19 bi-rads 1 neg [place] of right breast negative as weWNL ll.  BMD:   05/22/22 osteoporotic T score -3.3 Colonoscopy: 2017 or 2018WNL. FH of colon polyp, was advised to have f/u in 5 years, declines, will do 10 years.  TDaP:  11/21/10 (she thinks she had it in 2015, will check).  Gardasil: NA   reports that she has never smoked. She has never used smokeless tobacco. She reports current alcohol use of about 1.0 standard drink of alcohol per week. She reports that she does not use drugs.  Retired from the [employer] ([employer] fast stream[employer]). Avid exerciser.   Past Medical History:  Diagnosis Date   Abnormal Pap smear and cervical HPV (human papillomavirus) 1989   Cone biopsy  ????dysplasia   Anxiety    mild on no meds   Endometriosis    Fatty liver    Fibroid    Infection    UTI   Liver hemangioma    Menopausal hot flushes    Pure hypercholesterolemia    Vitamin D insufficiency     Past Surgical History:  Procedure Laterality Date   APPENDECTOMY  58    CERVICAL CONE BIOPSY  89   DILATION AND CURETTAGE OF UTERUS  3/11   ear operation Bilateral 89 67 68   ear drum repair   MYOMECTOMY  2000   UTERINE FIBROID EMBOLIZATION  2003    Current Outpatient Medications  Medication Sig Dispense Refill   acetaminophen (TYLENOL) 650 MG CR tablet Take 1 tablet (650 mg total) by mouth every 8 (eight) hours as needed for pain. 90 tablet 3   Cholecalciferol (VITAMIN D3) 5000 units CAPS Take 1 capsule by mouth every other day.      Cyanocobalamin (VITAMIN B-12 PO) Take 1,000 mg by mouth. Take 2-3 times weekly     Estradiol 10 MCG TABS vaginal tablet Place 1 tablet (10 mcg total) vaginally at bedtime for 7 days, THEN 1 tablet (10 mcg total) 2 (two) times a week. 24 tablet 1   Multiple Vitamin (MULTIVITAMIN) tablet Take 1 tablet by mouth daily.     folic acid (FOLVITE) 1 MG tablet Take 5 tablets (5 mg total) by mouth daily. (Patient not taking: Reported on 06/24/2022) 150 tablet 0   No current facility-administered medications for this visit.    Family History  Problem Relation Age of Onset   Dementia Mother    Cancer Sister  breast  premenopausal   Breast cancer Sister 40   Cancer Maternal Grandmother        breast   Sudden death Neg Hx    Hypertension Neg Hx    Heart attack Neg Hx    Hyperlipidemia Neg Hx     Review of Systems  Exam:   BP 128/72   Pulse 71   Ht 5\' 6"  (1.676 m)   Wt 125 lb (56.7 kg)   LMP 05/14/2010 (Within Years)   SpO2 99%   BMI 20.18 kg/m   Weight change: @WEIGHTCHANGE @ Height:   Height: 5\' 6"  (167.6 cm)  Ht Readings from Last 3 Encounters:  06/24/22 5\' 6"  (1.676 m)  04/18/22 5\' 6"  (1.676 m)  11/14/21 5\' 6"  (1.676 m)    General appearance: alert, cooperative and appears stated age Head: Normocephalic, without obvious abnormality, atraumatic Neck: no adenopathy, supple, symmetrical, trachea midline and thyroid normal to inspection and palpation Lungs: clear to auscultation bilaterally Cardiovascular:  regular rate and rhythm Breasts: normal appearance, no masses or tenderness Abdomen: soft, non-tender; non distended,  no masses,  no organomegaly Extremities: extremities normal, atraumatic, no cyanosis or edema Skin: Skin color, texture, turgor normal. No rashes or lesions Lymph nodes: Cervical, supraclavicular, and axillary nodes normal. No abnormal inguinal nodes palpated Neurologic: Grossly normal   Pelvic: External genitalia:  no lesions              Urethra:  normal appearing urethra with no masses, tenderness or lesions              Bartholins and Skenes: normal                 Vagina:mildly atrophic appearing vagina (improved) with normal color, no discharge, no lesions              Cervix: no lesions               Bimanual Exam:  Uterus:  normal size, contour, position, consistency, mobility, non-tender              Adnexa: no mass, fullness, tenderness               Rectovaginal: Confirms               Anus:  normal sphincter tone, no lesions   1. Well woman exam Discussed breast self exam Discussed calcium and vit D intake No pap this year Mammogram overdue, encouraged to schedule it Colonoscopy was recommended in 5 years, she wants to wait until 10 year   2. Age-related osteoporosis without current pathological fracture DEXA reviewed, treatment recommended. Labs from primary reviewed. -Discussed calcium, vit d, exercise, avoiding falls - VITAMIN D 25 Hydroxy (Vit-D Deficiency, Fractures) -Recommended fosamax, reviewed risks and side effects, she declines -Reading information given   3. Vaginal atrophy Some improvement. May need vaginal dilators if she decides to be sexually active.  - Estradiol 10 MCG TABS vaginal tablet; Place 1 tablet (10 mcg total) vaginally at bedtime for 7 days, THEN 1 tablet (10 mcg total) 2 (two) times a week.  Dispense: 24 tablet; Refill: 3 -recommend mammogram, if she doesn't have the mammogram I recommended she stop the vaginal estrogen.

## 2022-06-18 DIAGNOSIS — M25651 Stiffness of right hip, not elsewhere classified: Secondary | ICD-10-CM | POA: Diagnosis not present

## 2022-06-18 DIAGNOSIS — M25652 Stiffness of left hip, not elsewhere classified: Secondary | ICD-10-CM | POA: Diagnosis not present

## 2022-06-18 DIAGNOSIS — M25551 Pain in right hip: Secondary | ICD-10-CM | POA: Diagnosis not present

## 2022-06-18 DIAGNOSIS — M25552 Pain in left hip: Secondary | ICD-10-CM | POA: Diagnosis not present

## 2022-06-19 ENCOUNTER — Ambulatory Visit: Payer: Federal, State, Local not specified - PPO | Admitting: Sports Medicine

## 2022-06-19 DIAGNOSIS — R7983 Abnormal findings of blood amino-acid level: Secondary | ICD-10-CM

## 2022-06-19 DIAGNOSIS — M160 Bilateral primary osteoarthritis of hip: Secondary | ICD-10-CM | POA: Diagnosis not present

## 2022-06-19 DIAGNOSIS — G6289 Other specified polyneuropathies: Secondary | ICD-10-CM

## 2022-06-19 NOTE — Progress Notes (Addendum)
    Procedures performed today:    None.  Independent interpretation of notes and tests performed by another provider:   None.  Brief History, Exam, Impression, and Recommendations:    Homocysteinemia Homocystine was elevated on neuropathy workup, she has been increasing multivitamin with folic acid, we will recheck homocystine. If persistently elevated it is absolutely reasonable to do a folic acid tablet daily.  Adding folic acid supplementation, can recheck with PCP in a month.  Primary osteoarthritis of both hips Bilateral hip osteoarthritis, good improvement with Tylenol, home physical therapy, return as needed.  Peripheral neuropathy Bilateral foot paresthesias, all toes, top and the bottom of the foot bilaterally, nothing in the hands, known lumbar DDD, serum workup for neuropathy was negative, she did however have low sodium, she was advised by PCP to decrease liquid/water consumption, she has done a good job with this so we will recheck her sodium levels today. Hyponatremia can certainly be a reason for paresthesias.    ____________________________________________ Ihor Austin. Benjamin Stain, M.D., ABFM., CAQSM., AME. Primary Care and Sports Medicine [facility]  Adjunct Professor of Family Medicine  [employer]  Restaurant manager, fast food

## 2022-06-19 NOTE — Assessment & Plan Note (Signed)
Bilateral foot paresthesias, all toes, top and the bottom of the foot bilaterally, nothing in the hands, known lumbar DDD, serum workup for neuropathy was negative, she did however have low sodium, she was advised by PCP to decrease liquid/water consumption, she has done a good job with this so we will recheck her sodium levels today. Hyponatremia can certainly be a reason for paresthesias.

## 2022-06-19 NOTE — Assessment & Plan Note (Signed)
Bilateral hip osteoarthritis, good improvement with Tylenol, home physical therapy, return as needed.

## 2022-06-19 NOTE — Assessment & Plan Note (Addendum)
Homocystine was elevated on neuropathy workup, she has been increasing multivitamin with folic acid, we will recheck homocystine. If persistently elevated it is absolutely reasonable to do a folic acid tablet daily.  Adding folic acid supplementation, can recheck with PCP in a month.

## 2022-06-20 ENCOUNTER — Other Ambulatory Visit (HOSPITAL_BASED_OUTPATIENT_CLINIC_OR_DEPARTMENT_OTHER): Payer: Self-pay

## 2022-06-20 LAB — COMPREHENSIVE METABOLIC PANEL
AG Ratio: 1.7 (calc) (ref 1.0–2.5)
ALT: 13 U/L (ref 6–29)
AST: 20 U/L (ref 10–35)
Albumin: 4.3 g/dL (ref 3.6–5.1)
Alkaline phosphatase (APISO): 80 U/L (ref 37–153)
BUN: 12 mg/dL (ref 7–25)
CO2: 29 mmol/L (ref 20–32)
Calcium: 9.4 mg/dL (ref 8.6–10.4)
Chloride: 101 mmol/L (ref 98–110)
Creat: 0.77 mg/dL (ref 0.50–1.05)
Globulin: 2.6 g/dL (calc) (ref 1.9–3.7)
Glucose, Bld: 67 mg/dL (ref 65–99)
Potassium: 4 mmol/L (ref 3.5–5.3)
Sodium: 137 mmol/L (ref 135–146)
Total Bilirubin: 0.6 mg/dL (ref 0.2–1.2)
Total Protein: 6.9 g/dL (ref 6.1–8.1)

## 2022-06-20 LAB — HOMOCYSTEINE: Homocysteine: 11.7 umol/L — ABNORMAL HIGH (ref <10.4)

## 2022-06-20 MED ORDER — FOLIC ACID 1 MG PO TABS
5.0000 mg | ORAL_TABLET | Freq: Every day | ORAL | 0 refills | Status: DC
Start: 2022-06-20 — End: 2023-01-27
  Filled 2022-06-20 (×2): qty 150, 30d supply, fill #0

## 2022-06-20 NOTE — Addendum Note (Signed)
Addended by: Monica Becton on: 06/20/2022 11:50 AM   Modules accepted: Orders

## 2022-06-24 ENCOUNTER — Other Ambulatory Visit (HOSPITAL_BASED_OUTPATIENT_CLINIC_OR_DEPARTMENT_OTHER): Payer: Self-pay

## 2022-06-24 ENCOUNTER — Encounter: Payer: Self-pay | Admitting: Obstetrics and Gynecology

## 2022-06-24 ENCOUNTER — Ambulatory Visit (INDEPENDENT_AMBULATORY_CARE_PROVIDER_SITE_OTHER): Payer: Federal, State, Local not specified - PPO | Admitting: Obstetrics and Gynecology

## 2022-06-24 VITALS — BP 128/72 | HR 71 | Ht 66.0 in | Wt 125.0 lb

## 2022-06-24 DIAGNOSIS — N952 Postmenopausal atrophic vaginitis: Secondary | ICD-10-CM | POA: Diagnosis not present

## 2022-06-24 DIAGNOSIS — M25551 Pain in right hip: Secondary | ICD-10-CM | POA: Diagnosis not present

## 2022-06-24 DIAGNOSIS — M810 Age-related osteoporosis without current pathological fracture: Secondary | ICD-10-CM

## 2022-06-24 DIAGNOSIS — M25651 Stiffness of right hip, not elsewhere classified: Secondary | ICD-10-CM | POA: Diagnosis not present

## 2022-06-24 DIAGNOSIS — M25652 Stiffness of left hip, not elsewhere classified: Secondary | ICD-10-CM | POA: Diagnosis not present

## 2022-06-24 DIAGNOSIS — Z01419 Encounter for gynecological examination (general) (routine) without abnormal findings: Secondary | ICD-10-CM | POA: Diagnosis not present

## 2022-06-24 DIAGNOSIS — M25552 Pain in left hip: Secondary | ICD-10-CM | POA: Diagnosis not present

## 2022-06-24 MED ORDER — ESTRADIOL 10 MCG VA TABS
ORAL_TABLET | VAGINAL | 3 refills | Status: AC
Start: 2022-06-24 — End: ?
  Filled 2022-06-24: qty 24, fill #0
  Filled 2022-07-15: qty 24, 63d supply, fill #0
  Filled 2022-09-25: qty 24, 63d supply, fill #1
  Filled 2022-12-23: qty 24, 63d supply, fill #2
  Filled 2023-03-16: qty 24, 63d supply, fill #3

## 2022-06-24 NOTE — Patient Instructions (Signed)

## 2022-06-25 LAB — VITAMIN D 25 HYDROXY (VIT D DEFICIENCY, FRACTURES): Vit D, 25-Hydroxy: 43 ng/mL (ref 30–100)

## 2022-06-26 DIAGNOSIS — M25651 Stiffness of right hip, not elsewhere classified: Secondary | ICD-10-CM | POA: Diagnosis not present

## 2022-06-26 DIAGNOSIS — M25551 Pain in right hip: Secondary | ICD-10-CM | POA: Diagnosis not present

## 2022-06-26 DIAGNOSIS — M25652 Stiffness of left hip, not elsewhere classified: Secondary | ICD-10-CM | POA: Diagnosis not present

## 2022-06-26 DIAGNOSIS — M25552 Pain in left hip: Secondary | ICD-10-CM | POA: Diagnosis not present

## 2022-06-30 DIAGNOSIS — M25552 Pain in left hip: Secondary | ICD-10-CM | POA: Diagnosis not present

## 2022-06-30 DIAGNOSIS — M25651 Stiffness of right hip, not elsewhere classified: Secondary | ICD-10-CM | POA: Diagnosis not present

## 2022-06-30 DIAGNOSIS — M25652 Stiffness of left hip, not elsewhere classified: Secondary | ICD-10-CM | POA: Diagnosis not present

## 2022-06-30 DIAGNOSIS — M25551 Pain in right hip: Secondary | ICD-10-CM | POA: Diagnosis not present

## 2022-07-02 DIAGNOSIS — M25652 Stiffness of left hip, not elsewhere classified: Secondary | ICD-10-CM | POA: Diagnosis not present

## 2022-07-02 DIAGNOSIS — M25552 Pain in left hip: Secondary | ICD-10-CM | POA: Diagnosis not present

## 2022-07-02 DIAGNOSIS — M25551 Pain in right hip: Secondary | ICD-10-CM | POA: Diagnosis not present

## 2022-07-02 DIAGNOSIS — M25651 Stiffness of right hip, not elsewhere classified: Secondary | ICD-10-CM | POA: Diagnosis not present

## 2022-07-05 DIAGNOSIS — R2681 Unsteadiness on feet: Secondary | ICD-10-CM | POA: Diagnosis not present

## 2022-07-07 ENCOUNTER — Other Ambulatory Visit (HOSPITAL_BASED_OUTPATIENT_CLINIC_OR_DEPARTMENT_OTHER): Payer: Self-pay

## 2022-07-07 DIAGNOSIS — M25551 Pain in right hip: Secondary | ICD-10-CM | POA: Diagnosis not present

## 2022-07-07 DIAGNOSIS — M25652 Stiffness of left hip, not elsewhere classified: Secondary | ICD-10-CM | POA: Diagnosis not present

## 2022-07-07 DIAGNOSIS — M25651 Stiffness of right hip, not elsewhere classified: Secondary | ICD-10-CM | POA: Diagnosis not present

## 2022-07-07 DIAGNOSIS — M25552 Pain in left hip: Secondary | ICD-10-CM | POA: Diagnosis not present

## 2022-07-09 DIAGNOSIS — M25552 Pain in left hip: Secondary | ICD-10-CM | POA: Diagnosis not present

## 2022-07-09 DIAGNOSIS — M25551 Pain in right hip: Secondary | ICD-10-CM | POA: Diagnosis not present

## 2022-07-09 DIAGNOSIS — M25652 Stiffness of left hip, not elsewhere classified: Secondary | ICD-10-CM | POA: Diagnosis not present

## 2022-07-09 DIAGNOSIS — M25651 Stiffness of right hip, not elsewhere classified: Secondary | ICD-10-CM | POA: Diagnosis not present

## 2022-07-15 ENCOUNTER — Other Ambulatory Visit (HOSPITAL_BASED_OUTPATIENT_CLINIC_OR_DEPARTMENT_OTHER): Payer: Self-pay

## 2022-07-16 DIAGNOSIS — M25651 Stiffness of right hip, not elsewhere classified: Secondary | ICD-10-CM | POA: Diagnosis not present

## 2022-07-16 DIAGNOSIS — M25551 Pain in right hip: Secondary | ICD-10-CM | POA: Diagnosis not present

## 2022-07-16 DIAGNOSIS — M25552 Pain in left hip: Secondary | ICD-10-CM | POA: Diagnosis not present

## 2022-07-16 DIAGNOSIS — M25652 Stiffness of left hip, not elsewhere classified: Secondary | ICD-10-CM | POA: Diagnosis not present

## 2022-07-22 DIAGNOSIS — M25552 Pain in left hip: Secondary | ICD-10-CM | POA: Diagnosis not present

## 2022-07-22 DIAGNOSIS — M25651 Stiffness of right hip, not elsewhere classified: Secondary | ICD-10-CM | POA: Diagnosis not present

## 2022-07-22 DIAGNOSIS — M25652 Stiffness of left hip, not elsewhere classified: Secondary | ICD-10-CM | POA: Diagnosis not present

## 2022-07-22 DIAGNOSIS — M25551 Pain in right hip: Secondary | ICD-10-CM | POA: Diagnosis not present

## 2022-07-24 DIAGNOSIS — M25652 Stiffness of left hip, not elsewhere classified: Secondary | ICD-10-CM | POA: Diagnosis not present

## 2022-07-24 DIAGNOSIS — M25651 Stiffness of right hip, not elsewhere classified: Secondary | ICD-10-CM | POA: Diagnosis not present

## 2022-07-24 DIAGNOSIS — M25552 Pain in left hip: Secondary | ICD-10-CM | POA: Diagnosis not present

## 2022-07-24 DIAGNOSIS — M25551 Pain in right hip: Secondary | ICD-10-CM | POA: Diagnosis not present

## 2022-08-15 ENCOUNTER — Telehealth: Payer: Self-pay | Admitting: Sports Medicine

## 2022-08-15 DIAGNOSIS — M5416 Radiculopathy, lumbar region: Secondary | ICD-10-CM

## 2022-08-15 NOTE — Telephone Encounter (Signed)
Patient called in for a MRI order. States that it was given as an option for her last appt. Please advise.

## 2022-08-15 NOTE — Telephone Encounter (Signed)
We have talked her hips, neuropathy, back, cervical spine, I am going to need more information.

## 2022-08-18 ENCOUNTER — Encounter: Payer: Self-pay | Admitting: Internal Medicine

## 2022-08-18 ENCOUNTER — Encounter: Payer: Self-pay | Admitting: Sports Medicine

## 2022-08-18 DIAGNOSIS — E782 Mixed hyperlipidemia: Secondary | ICD-10-CM

## 2022-08-18 NOTE — Telephone Encounter (Signed)
Okay potentially coming from the lumbar spine, I will order the lumbar spine MRI, she did not yet have a lumbar spine x-ray so she needs to get this done first before her insurance will approve the MRI, I have ordered both.

## 2022-08-20 ENCOUNTER — Ambulatory Visit: Payer: Federal, State, Local not specified - PPO | Admitting: Sports Medicine

## 2022-08-20 DIAGNOSIS — M160 Bilateral primary osteoarthritis of hip: Secondary | ICD-10-CM

## 2022-08-20 NOTE — Patient Instructions (Signed)
Orthopedic surgeon recommendations are Dr. Jodi Geralds and Dr. Gean Birchwood, but keep in mind you do not need 1 at this juncture.

## 2022-08-20 NOTE — Assessment & Plan Note (Signed)
This is a pleasant 62 year old female, she has bilateral hip osteoarthritis, she did well with Tylenol, we will have her restart her home physical therapy, continue Tylenol potentially meloxicam as well if needed. I explained her the pathophysiology and natural history of hip osteoarthritis, she was very afraid that she would need a hip replacement, I advised her that this was not needed, based on her response to conservative treatment. Ultimately she can come back to see me as needed. We did discuss her lumbar spine, her back pain and extremity paresthesias are mild enough and do not affect her quality of life so we will hold off on lumbar spine x-rays and MRI.

## 2022-08-20 NOTE — Progress Notes (Signed)
    Procedures performed today:    None.  Independent interpretation of notes and tests performed by another provider:   None.  Brief History, Exam, Impression, and Recommendations:    Primary osteoarthritis of both hips This is a pleasant 62 year old female, she has bilateral hip osteoarthritis, she did well with Tylenol, we will have her restart her home physical therapy, continue Tylenol potentially meloxicam as well if needed. I explained her the pathophysiology and natural history of hip osteoarthritis, she was very afraid that she would need a hip replacement, I advised her that this was not needed, based on her response to conservative treatment. Ultimately she can come back to see me as needed. We did discuss her lumbar spine, her back pain and extremity paresthesias are mild enough and do not affect her quality of life so we will hold off on lumbar spine x-rays and MRI.  I spent 30 minutes of total time managing this patient today, this includes chart review, face to face, and non-face to face time.  ____________________________________________ Ihor Austin. Benjamin Stain, M.D., ABFM., CAQSM., AME. Primary Care and Sports Medicine [facility]  Adjunct Professor of Family Medicine  [employer]  Restaurant manager, fast food

## 2022-08-28 ENCOUNTER — Ambulatory Visit: Payer: Federal, State, Local not specified - PPO | Admitting: Sports Medicine

## 2022-09-08 ENCOUNTER — Ambulatory Visit: Payer: Federal, State, Local not specified - PPO | Admitting: Orthopaedic Surgery

## 2022-11-19 ENCOUNTER — Ambulatory Visit: Payer: Federal, State, Local not specified - PPO | Admitting: Dietician

## 2022-11-21 ENCOUNTER — Ambulatory Visit: Payer: Federal, State, Local not specified - PPO | Admitting: Dietician

## 2022-12-23 ENCOUNTER — Other Ambulatory Visit (HOSPITAL_BASED_OUTPATIENT_CLINIC_OR_DEPARTMENT_OTHER): Payer: Self-pay

## 2022-12-30 ENCOUNTER — Other Ambulatory Visit (HOSPITAL_BASED_OUTPATIENT_CLINIC_OR_DEPARTMENT_OTHER): Payer: Self-pay

## 2023-01-26 ENCOUNTER — Ambulatory Visit: Payer: Federal, State, Local not specified - PPO | Admitting: Obstetrics and Gynecology

## 2023-01-27 ENCOUNTER — Ambulatory Visit: Payer: Federal, State, Local not specified - PPO | Admitting: Obstetrics and Gynecology

## 2023-01-27 ENCOUNTER — Encounter: Payer: Self-pay | Admitting: Obstetrics and Gynecology

## 2023-01-27 VITALS — BP 110/64 | HR 65

## 2023-01-27 DIAGNOSIS — N644 Mastodynia: Secondary | ICD-10-CM

## 2023-01-27 DIAGNOSIS — M810 Age-related osteoporosis without current pathological fracture: Secondary | ICD-10-CM

## 2023-01-27 DIAGNOSIS — Z1231 Encounter for screening mammogram for malignant neoplasm of breast: Secondary | ICD-10-CM

## 2023-01-27 DIAGNOSIS — M8080XG Other osteoporosis with current pathological fracture, unspecified site, subsequent encounter for fracture with delayed healing: Secondary | ICD-10-CM

## 2023-01-27 NOTE — Progress Notes (Signed)
   Acute Office Visit  Subjective:    Patient ID: ARDEAN SIMONICH, female    DOB: 1960-01-24, 63 y.o.   MRN: [id number]   HPI 63 y.o. presents today for breast pain (Pt c/o rt breast pain//jj) Reports since 2021 and had normal MMG then.  No repeat MMG since then. Her sister had breast cancer that was diagnosed at age 17. She denies any discharge from breast.  Crusting seen  on the nipples. No skin changes or palpable masses per patient.  Patient also with severe osteoporosis.  She declines medication and feels fine.  She states she is exercising and will see what the bone scan shows in one year with this modification. She does not drink etoh and is taking calcium  and vit D   Patient's last menstrual period was 05/14/2010 (within years).    Review of Systems     Objective:    OBGyn Exam  BP 110/64   Pulse 65   LMP 05/14/2010 (Within Years)   SpO2 99%  Wt Readings from Last 3 Encounters:  06/24/22 125 lb (56.7 kg)  05/07/22 123 lb 12.8 oz (56.2 kg)  04/18/22 119 lb (54 kg)        Patient informed chaperone available to be present for breast and/or pelvic exam. Patient has requested no chaperone to be present. Patient has been advised what will be completed during breast and pelvic exam.   Assessment & Plan:  Referral for diagnostic mammogram and US  placed Encouraged medication to help with bones and risk for fracture.  She would still like to wait and see what the dxa scan shows this year.  Almarie MARLA Carpen

## 2023-01-28 DIAGNOSIS — D2261 Melanocytic nevi of right upper limb, including shoulder: Secondary | ICD-10-CM | POA: Diagnosis not present

## 2023-01-28 DIAGNOSIS — D2262 Melanocytic nevi of left upper limb, including shoulder: Secondary | ICD-10-CM | POA: Diagnosis not present

## 2023-01-28 DIAGNOSIS — L821 Other seborrheic keratosis: Secondary | ICD-10-CM | POA: Diagnosis not present

## 2023-01-28 DIAGNOSIS — D225 Melanocytic nevi of trunk: Secondary | ICD-10-CM | POA: Diagnosis not present

## 2023-01-30 ENCOUNTER — Telehealth: Payer: Self-pay

## 2023-01-30 DIAGNOSIS — N644 Mastodynia: Secondary | ICD-10-CM

## 2023-01-30 NOTE — Telephone Encounter (Signed)
Pt LVM in triage line stating that she saw EB on Tuesday and she was supposed to organize for her to have a mammogram/US but has not heard anything yet from anyone about setting up an appt.  Pt advised that orders have been placed.  Will call for her to get her scheduled for their first available appt @ [facility] since that is where she went last, if the available date/time doesn't work for her, she can call and r/s and/or if the appt/date time is too far out for her nerves, then she can call on a daily basis to check for cancellations.   Pt voiced understanding/appreciation for cb & efforts. Advised to let her know via mychart regarding breast imaging appt date/time.    Spoke w/ [facility] @ got the pt scheduled for 02/12/2023 @ 240. Will notify the pt via mychart.

## 2023-02-03 ENCOUNTER — Encounter: Payer: Self-pay | Admitting: Internal Medicine

## 2023-02-03 DIAGNOSIS — M810 Age-related osteoporosis without current pathological fracture: Secondary | ICD-10-CM | POA: Insufficient documentation

## 2023-02-03 NOTE — Patient Instructions (Addendum)

## 2023-02-03 NOTE — Progress Notes (Unsigned)
Subjective:    Patient ID: Danielle Arnold, female    DOB: October 27, 1960, 63 y.o.   MRN: [id number]      HPI Danielle Arnold is here for a Physical exam and her chronic medical problems.   No concerns.  No changes in health since she was here last.   Medications and allergies reviewed with patient and updated if appropriate.  Current Outpatient Medications on File Prior to Visit  Medication Sig Dispense Refill   Cholecalciferol (VITAMIN D3) 5000 units CAPS Take 1 capsule by mouth every other day.      Cyanocobalamin (VITAMIN B-12 PO) Take 1,000 mg by mouth daily. Take 2-3 times weekly     Estradiol 10 MCG TABS vaginal tablet Place 1 tablet (10 mcg total) vaginally at bedtime for 7 days, THEN 1 tablet (10 mcg total) 2 (two) times a week. 24 tablet 3   Multiple Vitamin (MULTIVITAMIN) tablet Take 1 tablet by mouth daily.     No current facility-administered medications on file prior to visit.    Review of Systems  Constitutional:  Negative for fever.  Eyes:  Negative for visual disturbance.  Respiratory:  Negative for cough, shortness of breath and wheezing.   Cardiovascular:  Positive for palpitations. Negative for chest pain and leg swelling.  Gastrointestinal:  Negative for abdominal pain, blood in stool, constipation and diarrhea.       No gerd  Genitourinary:  Negative for dysuria.  Musculoskeletal:  Negative for arthralgias and back pain.  Skin:  Negative for rash.  Neurological:  Positive for headaches (occ - mild). Negative for light-headedness.  Psychiatric/Behavioral:  Positive for sleep disturbance. Negative for dysphoric mood. The patient is not nervous/anxious.        Objective:   Vitals:   02/04/23 0800  BP: 130/60  Pulse: (!) 50  Resp: 16  Temp: 97.6 F (36.4 C)  SpO2: 99%   Filed Weights   02/04/23 0800  Weight: 123 lb (55.8 kg)   Body mass index is 19.85 kg/m.  BP Readings from Last 3 Encounters:  02/04/23 130/60  01/27/23 110/64  06/24/22 128/72     Wt Readings from Last 3 Encounters:  02/04/23 123 lb (55.8 kg)  06/24/22 125 lb (56.7 kg)  05/07/22 123 lb 12.8 oz (56.2 kg)       Physical Exam Constitutional: She appears well-developed and well-nourished. No distress.  HENT:  Head: Normocephalic and atraumatic.  Right Ear: External ear normal. Normal ear canal and TM Left Ear: External ear normal.  Normal ear canal and TM Mouth/Throat: Oropharynx is clear and moist.  Eyes: Conjunctivae normal.  Neck: Neck supple. No tracheal deviation present. No thyromegaly present.  No carotid bruit  Cardiovascular: Normal rate, regular rhythm and normal heart sounds.   No murmur heard.  No edema. Pulmonary/Chest: Effort normal and breath sounds normal. No respiratory distress. She has no wheezes. She has no rales.  Breast: deferred   Abdominal: Soft. She exhibits no distension. There is no tenderness.  Lymphadenopathy: She has no cervical adenopathy.  Skin: Skin is warm and dry. She is not diaphoretic.  Psychiatric: She has a normal mood and affect. Her behavior is normal.     Lab Results  Component Value Date   WBC 4.3 05/13/2022   HGB 12.5 05/13/2022   HCT 38.0 05/13/2022   PLT 256 05/13/2022   GLUCOSE 67 06/19/2022   CHOL 163 11/14/2021   TRIG 71.0 11/14/2021   HDL 62.50 11/14/2021   LDLCALC 86 11/14/2021  ALT 13 06/19/2022   AST 20 06/19/2022   NA 137 06/19/2022   K 4.0 06/19/2022   CL 101 06/19/2022   CREATININE 0.77 06/19/2022   BUN 12 06/19/2022   CO2 29 06/19/2022   TSH 2.84 05/13/2022   HGBA1C 5.2 05/13/2022    The 10-year ASCVD risk score (Arnett DK, et al., 2019) is: 4.3%   Values used to calculate the score:     Age: 6 years     Sex: Female     Is Non-Hispanic African American: No     Diabetic: No     Tobacco smoker: No     Systolic Blood Pressure: 130 mmHg     Is BP treated: No     HDL Cholesterol: 62.5 mg/dL     Total Cholesterol: 163 mg/dL      Assessment & Plan:   Physical  exam: Screening blood work  ordered Exercise  regular - walking, gym - classes Weight  normal Substance abuse  none   Reviewed recommended immunizations.   Health Maintenance  Topic Date Due   Colonoscopy  01/12/2017   MAMMOGRAM  09/26/2021   COVID-19 Vaccine (1 - 2024-25 season) 02/20/2023 (Originally 09/14/2022)   INFLUENZA VACCINE  04/13/2023 (Originally 08/14/2022)   Zoster Vaccines- Shingrix (1 of 2) 05/05/2023 (Originally 09/10/2008)   DTaP/Tdap/Td (2 - Td or Tdap) 02/04/2024 (Originally 11/20/2020)   Cervical Cancer Screening (HPV/Pap Cotest)  08/14/2024   DEXA SCAN  05/21/2025   Hepatitis C Screening  Completed   HIV Screening  Completed   HPV VACCINES  Aged Out      Up-to-date with gynecology.  Had colonoscopy within the last 10 years-will get report    See Problem List for Assessment and Plan of chronic medical problems.

## 2023-02-04 ENCOUNTER — Encounter: Payer: Self-pay | Admitting: Internal Medicine

## 2023-02-04 ENCOUNTER — Ambulatory Visit (INDEPENDENT_AMBULATORY_CARE_PROVIDER_SITE_OTHER): Payer: Federal, State, Local not specified - PPO | Admitting: Internal Medicine

## 2023-02-04 ENCOUNTER — Encounter: Payer: Federal, State, Local not specified - PPO | Admitting: Internal Medicine

## 2023-02-04 VITALS — BP 130/60 | HR 50 | Temp 97.6°F | Resp 16 | Ht 66.0 in | Wt 123.0 lb

## 2023-02-04 DIAGNOSIS — Z0000 Encounter for general adult medical examination without abnormal findings: Secondary | ICD-10-CM | POA: Diagnosis not present

## 2023-02-04 DIAGNOSIS — E538 Deficiency of other specified B group vitamins: Secondary | ICD-10-CM | POA: Diagnosis not present

## 2023-02-04 DIAGNOSIS — E782 Mixed hyperlipidemia: Secondary | ICD-10-CM | POA: Diagnosis not present

## 2023-02-04 DIAGNOSIS — R7983 Abnormal findings of blood amino-acid level: Secondary | ICD-10-CM

## 2023-02-04 DIAGNOSIS — E559 Vitamin D deficiency, unspecified: Secondary | ICD-10-CM

## 2023-02-04 DIAGNOSIS — M810 Age-related osteoporosis without current pathological fracture: Secondary | ICD-10-CM

## 2023-02-04 LAB — COMPREHENSIVE METABOLIC PANEL
ALT: 14 U/L (ref 0–35)
AST: 20 U/L (ref 0–37)
Albumin: 4.7 g/dL (ref 3.5–5.2)
Alkaline Phosphatase: 87 U/L (ref 39–117)
BUN: 13 mg/dL (ref 6–23)
CO2: 29 meq/L (ref 19–32)
Calcium: 9.5 mg/dL (ref 8.4–10.5)
Chloride: 98 meq/L (ref 96–112)
Creatinine, Ser: 0.67 mg/dL (ref 0.40–1.20)
GFR: 92.38 mL/min (ref >60.00)
Glucose, Bld: 89 mg/dL (ref 70–99)
Potassium: 4 meq/L (ref 3.5–5.1)
Sodium: 135 meq/L (ref 135–145)
Total Bilirubin: 0.6 mg/dL (ref 0.2–1.2)
Total Protein: 7.6 g/dL (ref 6.0–8.3)

## 2023-02-04 LAB — CBC WITH DIFFERENTIAL/PLATELET
Basophils Absolute: 0 10*3/uL (ref 0.0–0.1)
Basophils Relative: 0.7 % (ref 0.0–3.0)
Eosinophils Absolute: 0 10*3/uL (ref 0.0–0.7)
Eosinophils Relative: 0.3 % (ref 0.0–5.0)
HCT: 42.2 % (ref 36.0–46.0)
Hemoglobin: 13.9 g/dL (ref 12.0–15.0)
Lymphocytes Relative: 23.3 % (ref 12.0–46.0)
Lymphs Abs: 0.9 10*3/uL (ref 0.7–4.0)
MCHC: 33 g/dL (ref 30.0–36.0)
MCV: 89.1 fL (ref 78.0–100.0)
Monocytes Absolute: 0.3 10*3/uL (ref 0.1–1.0)
Monocytes Relative: 7.8 % (ref 3.0–12.0)
Neutro Abs: 2.8 10*3/uL (ref 1.4–7.7)
Neutrophils Relative %: 67.9 % (ref 43.0–77.0)
Platelets: 276 10*3/uL (ref 150.0–400.0)
RBC: 4.73 Mil/uL (ref 3.87–5.11)
RDW: 13.3 % (ref 11.5–15.5)
WBC: 4.1 10*3/uL (ref 4.0–10.5)

## 2023-02-04 LAB — LIPID PANEL
Cholesterol: 188 mg/dL (ref 0–200)
HDL: 71 mg/dL (ref >39.00)
LDL Cholesterol: 102 mg/dL — ABNORMAL HIGH (ref 0–99)
NonHDL: 116.95
Total CHOL/HDL Ratio: 3
Triglycerides: 74 mg/dL (ref 0.0–149.0)
VLDL: 14.8 mg/dL (ref 0.0–40.0)

## 2023-02-04 LAB — VITAMIN B12: Vitamin B-12: 505 pg/mL (ref 211–911)

## 2023-02-04 LAB — VITAMIN D 25 HYDROXY (VIT D DEFICIENCY, FRACTURES): VITD: 38.15 ng/mL (ref 30.00–100.00)

## 2023-02-04 LAB — FOLATE: Folate: >25.2 ng/mL (ref >5.9)

## 2023-02-04 LAB — TSH: TSH: 4.92 u[IU]/mL (ref 0.35–5.50)

## 2023-02-04 NOTE — Assessment & Plan Note (Addendum)
Chronic Monitored by GYN Has deferred treatment-encouraged her to think about if her bone density has continued to decrease Continue regular exercise-walking, doing some weights Continue calcium, vitamin D Will check vitamin D level

## 2023-02-04 NOTE — Assessment & Plan Note (Addendum)
Chronic Taking a multivitamin, B12 with folate for the past 2 months Check homocystine level, folic acid level

## 2023-02-04 NOTE — Assessment & Plan Note (Signed)
Chronic Taking vitamin D daily Check vitamin D level  

## 2023-02-04 NOTE — Assessment & Plan Note (Signed)
Chronic Eats a plant-based diet Taking vitamin B-12 supplementation Check vitamin B12 level

## 2023-02-04 NOTE — Assessment & Plan Note (Addendum)
Chronic Regular exercise and healthy diet encouraged Check lipid panel, CMP, TSH, CBC Low ASCVD risk Continue lifestyle control  Discussed CT CAC-she will let me know if she wants to do that Discussed LPA-will check with insurance around coverage

## 2023-02-06 LAB — HOMOCYSTEINE: Homocysteine: 11.3 umol/L — ABNORMAL HIGH (ref <10.4)

## 2023-02-08 ENCOUNTER — Encounter: Payer: Self-pay | Admitting: Internal Medicine

## 2023-02-12 ENCOUNTER — Ambulatory Visit
Admission: RE | Admit: 2023-02-12 | Discharge: 2023-02-12 | Disposition: A | Discharge status: Home / Self Care | Payer: Federal, State, Local not specified - PPO | Source: Ambulatory Visit | Attending: Obstetrics and Gynecology | Admitting: Obstetrics and Gynecology

## 2023-02-12 ENCOUNTER — Ambulatory Visit: Payer: Federal, State, Local not specified - PPO

## 2023-02-12 DIAGNOSIS — N644 Mastodynia: Secondary | ICD-10-CM

## 2023-02-12 DIAGNOSIS — Z803 Family history of malignant neoplasm of breast: Secondary | ICD-10-CM | POA: Diagnosis not present

## 2023-02-17 ENCOUNTER — Encounter: Payer: Self-pay | Admitting: Internal Medicine

## 2023-03-16 ENCOUNTER — Other Ambulatory Visit (HOSPITAL_BASED_OUTPATIENT_CLINIC_OR_DEPARTMENT_OTHER): Payer: Self-pay

## 2023-05-08 ENCOUNTER — Other Ambulatory Visit (HOSPITAL_BASED_OUTPATIENT_CLINIC_OR_DEPARTMENT_OTHER): Payer: Self-pay

## 2023-06-29 ENCOUNTER — Telehealth: Payer: Self-pay | Admitting: Obstetrics and Gynecology

## 2023-06-29 DIAGNOSIS — E2839 Other primary ovarian failure: Secondary | ICD-10-CM

## 2023-06-29 NOTE — Telephone Encounter (Signed)
 DXA referra;

## 2023-09-15 ENCOUNTER — Encounter: Payer: Self-pay | Admitting: Sports Medicine

## 2023-11-12 ENCOUNTER — Encounter (HOSPITAL_BASED_OUTPATIENT_CLINIC_OR_DEPARTMENT_OTHER): Payer: Self-pay

## 2023-11-12 ENCOUNTER — Emergency Department (HOSPITAL_BASED_OUTPATIENT_CLINIC_OR_DEPARTMENT_OTHER)
Admission: EM | Admit: 2023-11-12 | Discharge: 2023-11-12 | Disposition: A | Discharge status: Home / Self Care | Attending: Emergency Medicine | Admitting: Emergency Medicine

## 2023-11-12 ENCOUNTER — Emergency Department (HOSPITAL_BASED_OUTPATIENT_CLINIC_OR_DEPARTMENT_OTHER)

## 2023-11-12 ENCOUNTER — Other Ambulatory Visit: Payer: Self-pay

## 2023-11-12 DIAGNOSIS — R7989 Other specified abnormal findings of blood chemistry: Secondary | ICD-10-CM | POA: Insufficient documentation

## 2023-11-12 DIAGNOSIS — M5410 Radiculopathy, site unspecified: Secondary | ICD-10-CM | POA: Diagnosis not present

## 2023-11-12 DIAGNOSIS — R202 Paresthesia of skin: Secondary | ICD-10-CM | POA: Diagnosis present

## 2023-11-12 DIAGNOSIS — R55 Syncope and collapse: Secondary | ICD-10-CM | POA: Insufficient documentation

## 2023-11-12 DIAGNOSIS — M792 Neuralgia and neuritis, unspecified: Secondary | ICD-10-CM

## 2023-11-12 LAB — CBC WITH DIFFERENTIAL/PLATELET
Abs Immature Granulocytes: 0.01 K/uL (ref 0.00–0.07)
Basophils Absolute: 0 K/uL (ref 0.0–0.1)
Basophils Relative: 1 %
Eosinophils Absolute: 0 K/uL (ref 0.0–0.5)
Eosinophils Relative: 0 %
HCT: 41.8 % (ref 36.0–46.0)
Hemoglobin: 13.9 g/dL (ref 12.0–15.0)
Immature Granulocytes: 0 %
Lymphocytes Relative: 14 %
Lymphs Abs: 0.9 K/uL (ref 0.7–4.0)
MCH: 28.8 pg (ref 26.0–34.0)
MCHC: 33.3 g/dL (ref 30.0–36.0)
MCV: 86.5 fL (ref 80.0–100.0)
Monocytes Absolute: 0.3 K/uL (ref 0.1–1.0)
Monocytes Relative: 5 %
Neutro Abs: 4.8 K/uL (ref 1.7–7.7)
Neutrophils Relative %: 80 %
Platelets: 269 K/uL (ref 150–400)
RBC: 4.83 MIL/uL (ref 3.87–5.11)
RDW: 13.2 % (ref 11.5–15.5)
WBC: 6.1 K/uL (ref 4.0–10.5)
nRBC: 0 % (ref 0.0–0.2)

## 2023-11-12 LAB — COMPREHENSIVE METABOLIC PANEL WITH GFR
ALT: 21 U/L (ref 0–44)
AST: 29 U/L (ref 15–41)
Albumin: 4.6 g/dL (ref 3.5–5.0)
Alkaline Phosphatase: 103 U/L (ref 38–126)
Anion gap: 13 (ref 5–15)
BUN: 14 mg/dL (ref 8–23)
CO2: 25 mmol/L (ref 22–32)
Calcium: 9.8 mg/dL (ref 8.9–10.3)
Chloride: 97 mmol/L — ABNORMAL LOW (ref 98–111)
Creatinine, Ser: 0.79 mg/dL (ref 0.44–1.00)
GFR, Estimated: >60 mL/min (ref >60)
Glucose, Bld: 115 mg/dL — ABNORMAL HIGH (ref 70–99)
Potassium: 4 mmol/L (ref 3.5–5.1)
Sodium: 135 mmol/L (ref 135–145)
Total Bilirubin: 0.4 mg/dL (ref 0.0–1.2)
Total Protein: 7.6 g/dL (ref 6.5–8.1)

## 2023-11-12 LAB — TROPONIN T, HIGH SENSITIVITY
Troponin T High Sensitivity: <15 ng/L (ref 0–19)
Troponin T High Sensitivity: <15 ng/L (ref 0–19)

## 2023-11-12 LAB — LIPASE, BLOOD: Lipase: 65 U/L — ABNORMAL HIGH (ref 11–51)

## 2023-11-12 NOTE — Discharge Instructions (Signed)
 You were seen in the emerged permit today with almost passing out and some shoulder discomfort.  Your labs show normal electrolytes and that you are not actively having a heart attack.  I have referred you to the cardiology office to discuss your nearly passing out episodes.  Please return with any new or suddenly worsening symptoms.

## 2023-11-12 NOTE — ED Triage Notes (Signed)
 Pt states that their left shoulder had a sharp pain this morning and now it is tingling. It also radiates up her neck and down her arm. Also stated that she felt shakey this morning and was very sweaty.   Denies syncope. No known injury to shoulder.

## 2023-11-12 NOTE — ED Provider Notes (Signed)
 Emergency Department Provider Note   I have reviewed the triage vital signs and the nursing notes.   HISTORY  Chief Complaint Tingling   HPI Danielle Arnold is a 63 y.o. female with past history reviewed below presents emergency department for evaluation of pain in the left shoulder radiating down the left arm.  Symptoms were present shortly after waking this morning.  She felt pins and needles type pain in the left shoulder down the arm.  She felt some lightheadedness in the setting of to go have a bowel movement.  No chest pain, palpitations, shortness of breath.  No syncope.  No numbness.  No symptoms in the face or left leg.  No vision or speech changes.  She is currently feeling well with no active symptoms.   Past Medical History:  Diagnosis Date   Abnormal Pap smear and cervical HPV (human papillomavirus) 1989   Cone biopsy  ????dysplasia   Anxiety    mild on no meds   Endometriosis    Fatty liver    Fibroid    Infection    UTI   Liver hemangioma    Menopausal hot flushes    Pure hypercholesterolemia    Vitamin D  insufficiency     Review of Systems  Constitutional: No fever/chills Eyes: No visual changes. Cardiovascular: Denies chest pain. Respiratory: Denies shortness of breath. Musculoskeletal: Positive left shoulder pain.  Neurological: Negative for headaches. No weakness. Positive tingling. No numbness.   ____________________________________________   PHYSICAL EXAM:  VITAL SIGNS: ED Triage Vitals  Encounter Vitals Group     BP 11/12/23 0803 (!) 140/64     Pulse Rate 11/12/23 0803 69     Resp 11/12/23 0803 14     Temp 11/12/23 0803 97.7 F (36.5 C)     Temp Source 11/12/23 0803 Oral     SpO2 11/12/23 0803 100 %     Weight 11/12/23 0802 125 lb (56.7 kg)     Height 11/12/23 0802 5' 6 (1.676 m)   Constitutional: Alert and oriented. Well appearing and in no acute distress. Eyes: Conjunctivae are normal.  Head: Atraumatic. Nose: No  congestion/rhinnorhea. Mouth/Throat: Mucous membranes are moist.  Neck: No stridor.   Cardiovascular: Normal rate, regular rhythm. Good peripheral circulation. Grossly normal heart sounds.   Respiratory: Normal respiratory effort.  No retractions. Lungs CTAB. Gastrointestinal: Soft and nontender. No distention.  Musculoskeletal: No lower extremity tenderness nor edema. No gross deformities of extremities. Neurologic:  Normal speech and language.  5/5 strength the bilateral upper and lower extremities.  Normal sensation throughout the face, arm, leg.  No pronator drift.  Skin:  Skin is warm, dry and intact. No rash noted.  ____________________________________________   LABS (all labs ordered are listed, but only abnormal results are displayed)  Labs Reviewed  COMPREHENSIVE METABOLIC PANEL WITH GFR - Abnormal; Notable for the following components:      Result Value   Chloride 97 (*)    Glucose, Bld 115 (*)    All other components within normal limits  LIPASE, BLOOD - Abnormal; Notable for the following components:   Lipase 65 (*)    All other components within normal limits  CBC WITH DIFFERENTIAL/PLATELET  TROPONIN T, HIGH SENSITIVITY  TROPONIN T, HIGH SENSITIVITY   ____________________________________________   PROCEDURES  Procedure(s) performed:   Procedures  None  ____________________________________________   INITIAL IMPRESSION / ASSESSMENT AND PLAN / ED COURSE  Pertinent labs & imaging results that were available during my care of the  patient were reviewed by me and considered in my medical decision making (see chart for details).   This patient is Presenting for Evaluation of shoulder pain, which does require a range of treatment options, and is a complaint that involves a moderate risk of morbidity and mortality.  The Differential Diagnoses include MSK strain, radicular pain, compressive neuropathy, CVA, ACS, etc.   Clinical Laboratory Tests Ordered, included CMP  with normal K. No AKI. CBC without anemia. Troponin normal. Minimally elevated lipase without pancreatitis symptoms.   Radiologic Tests: Ordered shoulder x-ray but patient refused at bedside.   Cardiac Monitor Tracing which shows NSR.    Social Determinants of Health Risk patient is a non-smoker.   Medical Decision Making: Summary:  The patient presents the emergency department with shoulder pain and tingling into the arm.  No neurodeficits at this time to prompt code stroke activation or consider TIA.  Sounds more like pins/needles nerve pain rather than true numbness or weakness.  Plan for electrolyte screening, ACS evaluation, reassess.  Reevaluation with update and discussion with patient. Plan for close PCP follow up and referral to Cardiology considering atypical ACS symptoms.   Considered admission but ED workup is reassuring.   Patient's presentation is most consistent with acute presentation with potential threat to life or bodily function.   Disposition: discharge  ____________________________________________  FINAL CLINICAL IMPRESSION(S) / ED DIAGNOSES  Final diagnoses:  Near syncope  Radicular pain in left arm    Note:  This document was prepared using Dragon voice recognition software and may include unintentional dictation errors.  Fonda Law, MD, [facility] Emergency Medicine    Santino Kinsella, Fonda MATSU, MD 11/15/23 1106

## 2023-11-16 ENCOUNTER — Encounter: Payer: Self-pay | Admitting: Internal Medicine

## 2023-11-16 DIAGNOSIS — R200 Anesthesia of skin: Secondary | ICD-10-CM | POA: Insufficient documentation

## 2023-11-16 NOTE — Progress Notes (Unsigned)
 Subjective:    Patient ID: Danielle Arnold, female    DOB: [birth year], 63 y.o.   MRN: [id number]     HPI Danielle Arnold is here for follow up from the ED.   ED 11-12-23 for Tingling  She had sudden onset of pain in her left shoulder/upper back radiating down her left arm.  Symptoms started shortly after waking.  It was a pins and needles type pain in the left shoulder and down the arm.  She felt lightheaded in the setting of needing to have a BM.  She has not had some tingling sensation on the left side of her face.  No CP, palps, SOB, numbness, syncope.  No vision or speech changes.  EKG, troponin x 2, cmp, cbc unremarkable.  Lipase was slightly elevated.  ED recommended cardiology referral for atypical ACS symptoms.    She states sudden onset pain in left shoulder/upper back- then got lightheaded, sweating and dizzy, laid down, had couple of BMs, tingling in left arm, pain and tingling in left side of head.  In ED lightheadedness and sweating resolved.  Tingling in left arm was present.  Sensation in the arm eventually went away.  She has not had any symptoms since then.  She was unsure if she really needed to see a cardiologist or not recommended.  Medications and allergies reviewed with patient and updated if appropriate.  Current Outpatient Medications on File Prior to Visit  Medication Sig Dispense Refill   Cholecalciferol (VITAMIN D3) 5000 units CAPS Take 1 capsule by mouth every other day.      Cyanocobalamin  (VITAMIN B-12 PO) Take 1,000 mg by mouth daily. Take 2-3 times weekly     Estradiol  10 MCG TABS vaginal tablet Place 1 tablet (10 mcg total) vaginally at bedtime for 7 days, THEN 1 tablet (10 mcg total) 2 (two) times a week. 24 tablet 3   Multiple Vitamin (MULTIVITAMIN) tablet Take 1 tablet by mouth daily.     No current facility-administered medications on file prior to visit.     Review of Systems  Respiratory:  Positive for cough (sometimes after she eats). Negative  for shortness of breath and wheezing.   Cardiovascular:  Positive for palpitations (occ). Negative for chest pain and leg swelling.  Gastrointestinal:        No gerd  Neurological:  Positive for light-headedness (occ - strenuous exercise). Negative for headaches.       Objective:   Vitals:   11/17/23 0903  BP: 120/60  Pulse: 68  Temp: 98.4 F (36.9 C)  SpO2: 99%   BP Readings from Last 3 Encounters:  11/17/23 120/60  11/12/23 122/64  02/04/23 130/60   Wt Readings from Last 3 Encounters:  11/17/23 126 lb (57.2 kg)  11/12/23 125 lb (56.7 kg)  02/04/23 123 lb (55.8 kg)   Body mass index is 20.34 kg/m.    Physical Exam Constitutional:      General: She is not in acute distress.    Appearance: Normal appearance.  HENT:     Head: Normocephalic and atraumatic.  Eyes:     Conjunctiva/sclera: Conjunctivae normal.  Cardiovascular:     Rate and Rhythm: Normal rate and regular rhythm.     Heart sounds: Normal heart sounds.  Pulmonary:     Effort: Pulmonary effort is normal. No respiratory distress.     Breath sounds: Normal breath sounds. No wheezing.  Musculoskeletal:        General: No tenderness (Neck, left  upper back, left shoulder or left arm). Normal range of motion.     Cervical back: Neck supple.     Right lower leg: No edema.     Left lower leg: No edema.  Lymphadenopathy:     Cervical: No cervical adenopathy.  Skin:    General: Skin is warm and dry.     Findings: No rash.  Neurological:     Mental Status: She is alert. Mental status is at baseline.  Psychiatric:        Mood and Affect: Mood normal.        Behavior: Behavior normal.        Lab Results  Component Value Date   WBC 6.1 11/12/2023   HGB 13.9 11/12/2023   HCT 41.8 11/12/2023   PLT 269 11/12/2023   GLUCOSE 115 (H) 11/12/2023   CHOL 188 02/04/2023   TRIG 74.0 02/04/2023   HDL 71.00 02/04/2023   LDLCALC 102 (H) 02/04/2023   ALT 21 11/12/2023   AST 29 11/12/2023   NA 135 11/12/2023    K 4.0 11/12/2023   CL 97 (L) 11/12/2023   CREATININE 0.79 11/12/2023   BUN 14 11/12/2023   CO2 25 11/12/2023   TSH 4.92 02/04/2023   HGBA1C 5.2 05/13/2022     Assessment & Plan:    See Problem List for Assessment and Plan of chronic medical problems.

## 2023-11-17 ENCOUNTER — Ambulatory Visit (INDEPENDENT_AMBULATORY_CARE_PROVIDER_SITE_OTHER): Admitting: Internal Medicine

## 2023-11-17 ENCOUNTER — Telehealth: Payer: Self-pay | Admitting: Internal Medicine

## 2023-11-17 VITALS — BP 120/60 | HR 68 | Temp 98.4°F | Ht 66.0 in | Wt 126.0 lb

## 2023-11-17 DIAGNOSIS — R202 Paresthesia of skin: Secondary | ICD-10-CM | POA: Diagnosis not present

## 2023-11-17 DIAGNOSIS — R200 Anesthesia of skin: Secondary | ICD-10-CM

## 2023-11-17 NOTE — Telephone Encounter (Signed)
 PT would like to have labs prior to her appointment scheduled as there were no morning appointments available and she was told she needed to be fasting.

## 2023-11-17 NOTE — Patient Instructions (Addendum)
      Blood work was ordered.       Medications changes include :   None    A referral was ordered and someone will call you to schedule an appointment.     No follow-ups on file.

## 2023-11-17 NOTE — Assessment & Plan Note (Signed)
 Resolved Does not sound cardiac in nature - likely msk with some anxiety Discussed further evaluation with cardiology, Ct cac -- she will think about this but we both she does not need to see cardio at this time If symptoms recur she will let me know.

## 2023-11-27 ENCOUNTER — Other Ambulatory Visit: Payer: Self-pay

## 2023-11-27 DIAGNOSIS — E782 Mixed hyperlipidemia: Secondary | ICD-10-CM

## 2023-11-27 DIAGNOSIS — E538 Deficiency of other specified B group vitamins: Secondary | ICD-10-CM

## 2023-11-27 DIAGNOSIS — R739 Hyperglycemia, unspecified: Secondary | ICD-10-CM

## 2023-11-27 DIAGNOSIS — E559 Vitamin D deficiency, unspecified: Secondary | ICD-10-CM

## 2023-11-27 DIAGNOSIS — M810 Age-related osteoporosis without current pathological fracture: Secondary | ICD-10-CM

## 2023-11-27 NOTE — Telephone Encounter (Signed)
Lab orders placed today

## 2023-12-02 ENCOUNTER — Ambulatory Visit: Admitting: Nurse Practitioner

## 2024-02-22 ENCOUNTER — Encounter: Admitting: Internal Medicine

## 2024-06-21 ENCOUNTER — Encounter: Admitting: Internal Medicine
